# Patient Record
Sex: Male | Born: 1965 | Race: White | Hispanic: No | Marital: Single | Smoking: Former smoker
Health system: Southern US, Community
[De-identification: ages and names within clinical notes are randomized; demographics above are authoritative.]

## PROBLEM LIST (undated history)

## (undated) DIAGNOSIS — T7840XA Allergy, unspecified, initial encounter: Secondary | ICD-10-CM

## (undated) DIAGNOSIS — R0609 Other forms of dyspnea: Secondary | ICD-10-CM

## (undated) DIAGNOSIS — Z8719 Personal history of other diseases of the digestive system: Secondary | ICD-10-CM

## (undated) DIAGNOSIS — S0990XA Unspecified injury of head, initial encounter: Secondary | ICD-10-CM

## (undated) DIAGNOSIS — R7309 Other abnormal glucose: Secondary | ICD-10-CM

## (undated) DIAGNOSIS — R030 Elevated blood-pressure reading, without diagnosis of hypertension: Secondary | ICD-10-CM

## (undated) DIAGNOSIS — T887XXA Unspecified adverse effect of drug or medicament, initial encounter: Secondary | ICD-10-CM

## (undated) DIAGNOSIS — IMO0001 Reserved for inherently not codable concepts without codable children: Secondary | ICD-10-CM

## (undated) DIAGNOSIS — F329 Major depressive disorder, single episode, unspecified: Secondary | ICD-10-CM

## (undated) DIAGNOSIS — I10 Essential (primary) hypertension: Secondary | ICD-10-CM

## (undated) DIAGNOSIS — S069X9A Unspecified intracranial injury with loss of consciousness of unspecified duration, initial encounter: Secondary | ICD-10-CM

## (undated) DIAGNOSIS — G4733 Obstructive sleep apnea (adult) (pediatric): Secondary | ICD-10-CM

## (undated) DIAGNOSIS — R569 Unspecified convulsions: Secondary | ICD-10-CM

## (undated) DIAGNOSIS — G4730 Sleep apnea, unspecified: Secondary | ICD-10-CM

## (undated) DIAGNOSIS — E785 Hyperlipidemia, unspecified: Secondary | ICD-10-CM

## (undated) DIAGNOSIS — R55 Syncope and collapse: Secondary | ICD-10-CM

## (undated) DIAGNOSIS — F528 Other sexual dysfunction not due to a substance or known physiological condition: Secondary | ICD-10-CM

## (undated) DIAGNOSIS — I350 Nonrheumatic aortic (valve) stenosis: Secondary | ICD-10-CM

## (undated) DIAGNOSIS — R4789 Other speech disturbances: Secondary | ICD-10-CM

## (undated) DIAGNOSIS — G43909 Migraine, unspecified, not intractable, without status migrainosus: Secondary | ICD-10-CM

## (undated) HISTORY — DX: Unspecified convulsions: R56.9

## (undated) HISTORY — DX: Other abnormal glucose: R73.09

## (undated) HISTORY — DX: Elevated blood-pressure reading, without diagnosis of hypertension: R03.0

## (undated) HISTORY — DX: Reserved for inherently not codable concepts without codable children: IMO0001

## (undated) HISTORY — DX: Allergy, unspecified, initial encounter: T78.40XA

## (undated) HISTORY — DX: Syncope and collapse: R55

## (undated) HISTORY — DX: Migraine, unspecified, not intractable, without status migrainosus: G43.909

## (undated) HISTORY — DX: Unspecified injury of head, initial encounter: S09.90XA

## (undated) HISTORY — DX: Sleep apnea, unspecified: G47.30

## (undated) HISTORY — DX: Nonrheumatic aortic (valve) stenosis: I35.0

## (undated) HISTORY — PX: WISDOM TOOTH EXTRACTION: SHX21

## (undated) HISTORY — DX: Other sexual dysfunction not due to a substance or known physiological condition: F52.8

## (undated) HISTORY — PX: ANKLE SURGERY: SHX546

## (undated) HISTORY — DX: Major depressive disorder, single episode, unspecified: F32.9

## (undated) HISTORY — PX: VASECTOMY: SHX75

## (undated) HISTORY — DX: Other speech disturbances: R47.89

## (undated) HISTORY — DX: Other forms of dyspnea: R06.09

## (undated) HISTORY — DX: Obstructive sleep apnea (adult) (pediatric): G47.33

## (undated) HISTORY — DX: Unspecified intracranial injury with loss of consciousness of unspecified duration, initial encounter: S06.9X9A

## (undated) HISTORY — DX: Personal history of other diseases of the digestive system: Z87.19

## (undated) HISTORY — DX: Unspecified adverse effect of drug or medicament, initial encounter: T88.7XXA

## (undated) HISTORY — DX: Hyperlipidemia, unspecified: E78.5

## (undated) HISTORY — DX: Essential (primary) hypertension: I10

---
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
## 1999-12-15 ENCOUNTER — Encounter: Payer: Self-pay | Admitting: Emergency Medicine

## 1999-12-15 ENCOUNTER — Emergency Department (HOSPITAL_COMMUNITY): Admission: EM | Admit: 1999-12-15 | Discharge: 1999-12-15 | Payer: Self-pay | Admitting: Emergency Medicine

## 2003-11-29 ENCOUNTER — Encounter: Payer: Self-pay | Admitting: Cardiology

## 2003-11-29 ENCOUNTER — Ambulatory Visit (HOSPITAL_COMMUNITY): Admission: RE | Admit: 2003-11-29 | Discharge: 2003-11-29 | Payer: Self-pay | Admitting: Cardiology

## 2004-12-27 ENCOUNTER — Ambulatory Visit: Payer: Self-pay | Admitting: Internal Medicine

## 2005-01-14 ENCOUNTER — Ambulatory Visit: Payer: Self-pay | Admitting: Family Medicine

## 2005-05-05 ENCOUNTER — Ambulatory Visit: Payer: Self-pay | Admitting: Family Medicine

## 2005-05-13 ENCOUNTER — Ambulatory Visit: Payer: Self-pay | Admitting: Family Medicine

## 2005-08-11 ENCOUNTER — Ambulatory Visit: Payer: Self-pay | Admitting: Family Medicine

## 2005-08-18 ENCOUNTER — Ambulatory Visit: Payer: Self-pay | Admitting: Family Medicine

## 2005-11-18 ENCOUNTER — Ambulatory Visit: Payer: Self-pay | Admitting: Family Medicine

## 2005-11-23 ENCOUNTER — Ambulatory Visit: Payer: Self-pay | Admitting: Family Medicine

## 2006-05-11 ENCOUNTER — Ambulatory Visit: Payer: Self-pay | Admitting: Family Medicine

## 2006-05-24 ENCOUNTER — Ambulatory Visit: Payer: Self-pay | Admitting: Family Medicine

## 2006-06-08 ENCOUNTER — Ambulatory Visit: Payer: Self-pay | Admitting: Pulmonary Disease

## 2006-07-19 ENCOUNTER — Ambulatory Visit (HOSPITAL_BASED_OUTPATIENT_CLINIC_OR_DEPARTMENT_OTHER): Admission: RE | Admit: 2006-07-19 | Discharge: 2006-07-19 | Payer: Self-pay | Admitting: Pulmonary Disease

## 2006-07-24 ENCOUNTER — Ambulatory Visit: Payer: Self-pay | Admitting: Pulmonary Disease

## 2006-08-05 ENCOUNTER — Ambulatory Visit: Payer: Self-pay | Admitting: Pulmonary Disease

## 2006-10-29 ENCOUNTER — Ambulatory Visit: Payer: Self-pay | Admitting: Family Medicine

## 2006-10-29 LAB — CONVERTED CEMR LAB
ALT: 78 units/L — ABNORMAL HIGH (ref 0–40)
AST: 66 units/L — ABNORMAL HIGH (ref 0–37)
Albumin: 3.5 g/dL (ref 3.5–5.2)
Alkaline Phosphatase: 41 units/L (ref 39–117)
Bilirubin, Direct: 0.1 mg/dL (ref 0.0–0.3)
Cholesterol: 79 mg/dL (ref 0–200)
HDL: 20.5 mg/dL — ABNORMAL LOW (ref >39.0)
LDL Cholesterol: 32 mg/dL (ref 0–99)
Total Bilirubin: 0.6 mg/dL (ref 0.3–1.2)
Total CHOL/HDL Ratio: 3.9
Total Protein: 5.5 g/dL — ABNORMAL LOW (ref 6.0–8.3)
Triglycerides: 132 mg/dL (ref 0–149)
VLDL: 26 mg/dL (ref 0–40)

## 2007-04-15 DIAGNOSIS — F329 Major depressive disorder, single episode, unspecified: Secondary | ICD-10-CM

## 2007-04-15 DIAGNOSIS — F3289 Other specified depressive episodes: Secondary | ICD-10-CM

## 2007-04-15 DIAGNOSIS — Z8719 Personal history of other diseases of the digestive system: Secondary | ICD-10-CM

## 2007-04-15 HISTORY — DX: Other specified depressive episodes: F32.89

## 2007-04-15 HISTORY — DX: Major depressive disorder, single episode, unspecified: F32.9

## 2007-04-15 HISTORY — DX: Personal history of other diseases of the digestive system: Z87.19

## 2007-07-11 ENCOUNTER — Ambulatory Visit: Payer: Self-pay | Admitting: Family Medicine

## 2007-07-11 DIAGNOSIS — E785 Hyperlipidemia, unspecified: Secondary | ICD-10-CM

## 2007-07-11 HISTORY — DX: Hyperlipidemia, unspecified: E78.5

## 2007-07-13 LAB — CONVERTED CEMR LAB
ALT: 53 units/L (ref 0–53)
AST: 32 units/L (ref 0–37)
Albumin: 3.8 g/dL (ref 3.5–5.2)
Alkaline Phosphatase: 72 units/L (ref 39–117)
Bilirubin, Direct: 0.1 mg/dL (ref 0.0–0.3)
Cholesterol: 121 mg/dL (ref 0–200)
HDL: 21.2 mg/dL — ABNORMAL LOW (ref >39.0)
LDL Cholesterol: 74 mg/dL (ref 0–99)
Total Bilirubin: 0.6 mg/dL (ref 0.3–1.2)
Total CHOL/HDL Ratio: 5.7
Total Protein: 6.7 g/dL (ref 6.0–8.3)
Triglycerides: 129 mg/dL (ref 0–149)
VLDL: 26 mg/dL (ref 0–40)

## 2007-07-18 ENCOUNTER — Telehealth: Payer: Self-pay | Admitting: Family Medicine

## 2007-08-15 ENCOUNTER — Telehealth: Payer: Self-pay | Admitting: Family Medicine

## 2007-09-29 ENCOUNTER — Telehealth: Payer: Self-pay | Admitting: Family Medicine

## 2007-09-30 DIAGNOSIS — R4789 Other speech disturbances: Secondary | ICD-10-CM

## 2007-09-30 HISTORY — DX: Other speech disturbances: R47.89

## 2007-10-12 ENCOUNTER — Encounter: Admission: RE | Admit: 2007-10-12 | Discharge: 2007-11-03 | Payer: Self-pay | Admitting: Family Medicine

## 2007-10-25 ENCOUNTER — Encounter: Payer: Self-pay | Admitting: Family Medicine

## 2007-11-01 ENCOUNTER — Telehealth: Payer: Self-pay | Admitting: Family Medicine

## 2007-11-02 ENCOUNTER — Encounter: Payer: Self-pay | Admitting: Family Medicine

## 2008-03-30 ENCOUNTER — Telehealth: Payer: Self-pay | Admitting: Family Medicine

## 2008-04-02 ENCOUNTER — Ambulatory Visit: Payer: Self-pay | Admitting: Family Medicine

## 2008-04-02 DIAGNOSIS — F528 Other sexual dysfunction not due to a substance or known physiological condition: Secondary | ICD-10-CM

## 2008-04-02 DIAGNOSIS — S0990XA Unspecified injury of head, initial encounter: Secondary | ICD-10-CM | POA: Insufficient documentation

## 2008-04-02 HISTORY — DX: Other sexual dysfunction not due to a substance or known physiological condition: F52.8

## 2008-04-02 HISTORY — DX: Unspecified injury of head, initial encounter: S09.90XA

## 2008-04-24 ENCOUNTER — Encounter: Payer: Self-pay | Admitting: Family Medicine

## 2008-06-12 ENCOUNTER — Ambulatory Visit: Payer: Self-pay | Admitting: Family Medicine

## 2008-06-29 ENCOUNTER — Ambulatory Visit: Payer: Self-pay | Admitting: Family Medicine

## 2008-06-29 ENCOUNTER — Telehealth: Payer: Self-pay | Admitting: Family Medicine

## 2008-06-29 DIAGNOSIS — J029 Acute pharyngitis, unspecified: Secondary | ICD-10-CM

## 2008-08-06 ENCOUNTER — Ambulatory Visit: Payer: Self-pay | Admitting: Family Medicine

## 2008-11-15 ENCOUNTER — Ambulatory Visit: Payer: Self-pay | Admitting: Family Medicine

## 2008-11-16 LAB — CONVERTED CEMR LAB
ALT: 36 units/L (ref 0–53)
AST: 31 units/L (ref 0–37)
Albumin: 3.9 g/dL (ref 3.5–5.2)
Alkaline Phosphatase: 55 units/L (ref 39–117)
BUN: 9 mg/dL (ref 6–23)
Basophils Absolute: 0 10*3/uL (ref 0.0–0.1)
Basophils Relative: 0.6 % (ref 0.0–3.0)
Bilirubin, Direct: 0 mg/dL (ref 0.0–0.3)
CO2: 29 meq/L (ref 19–32)
Calcium: 9.3 mg/dL (ref 8.4–10.5)
Chloride: 108 meq/L (ref 96–112)
Cholesterol: 150 mg/dL (ref 0–200)
Creatinine, Ser: 1 mg/dL (ref 0.4–1.5)
Eosinophils Absolute: 0.2 10*3/uL (ref 0.0–0.7)
Eosinophils Relative: 3.1 % (ref 0.0–5.0)
GFR calc non Af Amer: 87.16 mL/min (ref >60)
Glucose, Bld: 87 mg/dL (ref 70–99)
HCT: 41.2 % (ref 39.0–52.0)
HDL: 25.7 mg/dL — ABNORMAL LOW (ref >39.00)
Hemoglobin: 14 g/dL (ref 13.0–17.0)
LDL Cholesterol: 87 mg/dL (ref 0–99)
Lymphocytes Relative: 37.2 % (ref 12.0–46.0)
Lymphs Abs: 2.1 10*3/uL (ref 0.7–4.0)
MCHC: 33.9 g/dL (ref 30.0–36.0)
MCV: 91.4 fL (ref 78.0–100.0)
Monocytes Absolute: 0.5 10*3/uL (ref 0.1–1.0)
Monocytes Relative: 9 % (ref 3.0–12.0)
Neutro Abs: 2.9 10*3/uL (ref 1.4–7.7)
Neutrophils Relative %: 50.1 % (ref 43.0–77.0)
Platelets: 240 10*3/uL (ref 150.0–400.0)
Potassium: 3.7 meq/L (ref 3.5–5.1)
RBC: 4.51 M/uL (ref 4.22–5.81)
RDW: 12.3 % (ref 11.5–14.6)
Sodium: 143 meq/L (ref 135–145)
TSH: 3.97 microintl units/mL (ref 0.35–5.50)
Total Bilirubin: 0.5 mg/dL (ref 0.3–1.2)
Total CHOL/HDL Ratio: 6
Total Protein: 6.5 g/dL (ref 6.0–8.3)
Triglycerides: 188 mg/dL — ABNORMAL HIGH (ref 0.0–149.0)
VLDL: 37.6 mg/dL (ref 0.0–40.0)
WBC: 5.7 10*3/uL (ref 4.5–10.5)

## 2009-03-07 ENCOUNTER — Ambulatory Visit: Payer: Self-pay | Admitting: Family Medicine

## 2009-03-18 ENCOUNTER — Encounter: Payer: Self-pay | Admitting: Family Medicine

## 2009-03-27 ENCOUNTER — Encounter: Payer: Self-pay | Admitting: Family Medicine

## 2009-05-22 ENCOUNTER — Telehealth: Payer: Self-pay | Admitting: Family Medicine

## 2009-09-24 ENCOUNTER — Encounter: Payer: Self-pay | Admitting: Family Medicine

## 2009-10-03 ENCOUNTER — Encounter: Admission: RE | Admit: 2009-10-03 | Discharge: 2009-10-03 | Payer: Self-pay | Admitting: Neurosurgery

## 2009-10-31 ENCOUNTER — Encounter: Payer: Self-pay | Admitting: Family Medicine

## 2009-11-19 ENCOUNTER — Ambulatory Visit: Payer: Self-pay | Admitting: Family Medicine

## 2009-11-19 ENCOUNTER — Telehealth: Payer: Self-pay | Admitting: Family Medicine

## 2009-11-19 DIAGNOSIS — J019 Acute sinusitis, unspecified: Secondary | ICD-10-CM | POA: Insufficient documentation

## 2009-11-19 DIAGNOSIS — IMO0001 Reserved for inherently not codable concepts without codable children: Secondary | ICD-10-CM

## 2009-11-19 HISTORY — DX: Reserved for inherently not codable concepts without codable children: IMO0001

## 2009-11-27 ENCOUNTER — Telehealth: Payer: Self-pay | Admitting: Family Medicine

## 2010-01-15 ENCOUNTER — Ambulatory Visit: Payer: Self-pay | Admitting: Family Medicine

## 2010-01-15 DIAGNOSIS — T887XXA Unspecified adverse effect of drug or medicament, initial encounter: Secondary | ICD-10-CM

## 2010-01-15 HISTORY — DX: Unspecified adverse effect of drug or medicament, initial encounter: T88.7XXA

## 2010-01-17 LAB — CONVERTED CEMR LAB
ALT: 29 units/L (ref 0–53)
AST: 28 units/L (ref 0–37)
Albumin: 4.3 g/dL (ref 3.5–5.2)
Alkaline Phosphatase: 61 units/L (ref 39–117)
BUN: 18 mg/dL (ref 6–23)
Basophils Absolute: 0 10*3/uL (ref 0.0–0.1)
Basophils Relative: 0.9 % (ref 0.0–3.0)
Bilirubin, Direct: 0.1 mg/dL (ref 0.0–0.3)
CO2: 27 meq/L (ref 19–32)
Calcium: 9.3 mg/dL (ref 8.4–10.5)
Chloride: 106 meq/L (ref 96–112)
Cholesterol: 224 mg/dL — ABNORMAL HIGH (ref 0–200)
Creatinine, Ser: 1.1 mg/dL (ref 0.4–1.5)
Direct LDL: 170 mg/dL
Eosinophils Absolute: 0.1 10*3/uL (ref 0.0–0.7)
Eosinophils Relative: 2.4 % (ref 0.0–5.0)
GFR calc non Af Amer: 81.93 mL/min (ref >60)
Glucose, Bld: 105 mg/dL — ABNORMAL HIGH (ref 70–99)
HCT: 44.8 % (ref 39.0–52.0)
HDL: 33.9 mg/dL — ABNORMAL LOW (ref >39.00)
Hemoglobin: 15.5 g/dL (ref 13.0–17.0)
Lymphocytes Relative: 35 % (ref 12.0–46.0)
Lymphs Abs: 2 10*3/uL (ref 0.7–4.0)
MCHC: 34.6 g/dL (ref 30.0–36.0)
MCV: 91.1 fL (ref 78.0–100.0)
Monocytes Absolute: 0.5 10*3/uL (ref 0.1–1.0)
Monocytes Relative: 8.2 % (ref 3.0–12.0)
Neutro Abs: 3.1 10*3/uL (ref 1.4–7.7)
Neutrophils Relative %: 53.5 % (ref 43.0–77.0)
Platelets: 291 10*3/uL (ref 150.0–400.0)
Potassium: 4.6 meq/L (ref 3.5–5.1)
RBC: 4.92 M/uL (ref 4.22–5.81)
RDW: 13.4 % (ref 11.5–14.6)
Sodium: 143 meq/L (ref 135–145)
TSH: 2.16 microintl units/mL (ref 0.35–5.50)
Total Bilirubin: 0.5 mg/dL (ref 0.3–1.2)
Total CHOL/HDL Ratio: 7
Total Protein: 6.7 g/dL (ref 6.0–8.3)
Triglycerides: 131 mg/dL (ref 0.0–149.0)
VLDL: 26.2 mg/dL (ref 0.0–40.0)
WBC: 5.7 10*3/uL (ref 4.5–10.5)

## 2010-01-30 ENCOUNTER — Telehealth: Payer: Self-pay | Admitting: Family Medicine

## 2010-01-31 ENCOUNTER — Encounter: Payer: Self-pay | Admitting: Family Medicine

## 2010-02-24 ENCOUNTER — Telehealth: Payer: Self-pay | Admitting: Family Medicine

## 2010-03-24 ENCOUNTER — Encounter: Payer: Self-pay | Admitting: Family Medicine

## 2010-04-08 ENCOUNTER — Encounter: Payer: Self-pay | Admitting: Family Medicine

## 2010-04-22 ENCOUNTER — Telehealth: Payer: Self-pay | Admitting: Family Medicine

## 2010-05-27 ENCOUNTER — Ambulatory Visit: Payer: Self-pay | Admitting: Family Medicine

## 2010-06-03 ENCOUNTER — Telehealth: Payer: Self-pay | Admitting: Family Medicine

## 2010-06-06 ENCOUNTER — Telehealth: Payer: Self-pay

## 2010-09-29 ENCOUNTER — Ambulatory Visit
Admission: RE | Admit: 2010-09-29 | Discharge: 2010-09-29 | Payer: Self-pay | Source: Home / Self Care | Attending: Family Medicine | Admitting: Family Medicine

## 2010-09-29 ENCOUNTER — Other Ambulatory Visit: Payer: Self-pay | Admitting: Family Medicine

## 2010-09-29 DIAGNOSIS — R7309 Other abnormal glucose: Secondary | ICD-10-CM | POA: Insufficient documentation

## 2010-09-29 DIAGNOSIS — R55 Syncope and collapse: Secondary | ICD-10-CM

## 2010-09-29 HISTORY — DX: Syncope and collapse: R55

## 2010-09-29 HISTORY — DX: Other abnormal glucose: R73.09

## 2010-09-29 LAB — BASIC METABOLIC PANEL
Chloride: 103 mEq/L (ref 96–112)
GFR: 76.59 mL/min (ref >60.00)
Glucose, Bld: 86 mg/dL (ref 70–99)
Potassium: 4.4 mEq/L (ref 3.5–5.1)
Sodium: 138 mEq/L (ref 135–145)

## 2010-09-29 LAB — LIPID PANEL
Cholesterol: 162 mg/dL (ref 0–200)
HDL: 31.1 mg/dL — ABNORMAL LOW (ref >39.00)
LDL Cholesterol: 96 mg/dL (ref 0–99)
Total CHOL/HDL Ratio: 5
Triglycerides: 174 mg/dL — ABNORMAL HIGH (ref 0.0–149.0)
VLDL: 34.8 mg/dL (ref 0.0–40.0)

## 2010-09-29 LAB — CBC WITH DIFFERENTIAL/PLATELET
Eosinophils Relative: 1.5 % (ref 0.0–5.0)
HCT: 37.3 % — ABNORMAL LOW (ref 39.0–52.0)
Hemoglobin: 12.6 g/dL — ABNORMAL LOW (ref 13.0–17.0)
Lymphs Abs: 2.4 10*3/uL (ref 0.7–4.0)
MCV: 87.3 fl (ref 78.0–100.0)
Monocytes Absolute: 0.5 10*3/uL (ref 0.1–1.0)
Monocytes Relative: 7.6 % (ref 3.0–12.0)
Neutro Abs: 3.9 10*3/uL (ref 1.4–7.7)
RDW: 15 % — ABNORMAL HIGH (ref 11.5–14.6)
WBC: 7 10*3/uL (ref 4.5–10.5)

## 2010-09-29 LAB — HEPATIC FUNCTION PANEL
ALT: 41 U/L (ref 0–53)
AST: 37 U/L (ref 0–37)
Albumin: 4.3 g/dL (ref 3.5–5.2)
Total Protein: 6.7 g/dL (ref 6.0–8.3)

## 2010-09-29 LAB — TSH: TSH: 2.16 u[IU]/mL (ref 0.35–5.50)

## 2010-09-30 NOTE — Miscellaneous (Signed)
Summary: new Rx  Medications Added CRESTOR 40 MG TABS (ROSUVASTATIN CALCIUM) 1 once daily       Clinical Lists Changes  Medications: Removed medication of CRESTOR 20 MG  TABS (ROSUVASTATIN CALCIUM) once daily Added new medication of CRESTOR 40 MG TABS (ROSUVASTATIN CALCIUM) 1 once daily - Signed Rx of CRESTOR 40 MG TABS (ROSUVASTATIN CALCIUM) 1 once daily;  #30 x 11;  Signed;  Entered by: Raechel Ache, RN;  Authorized by: Nelwyn Salisbury MD;  Method used: Electronically to [facility] [address] [place], [place]  [address], Ph: [phone], Fax: [phone]    Prescriptions: CRESTOR 40 MG TABS (ROSUVASTATIN CALCIUM) 1 once daily  #30 x 11   Entered by:   Raechel Ache, RN   Authorized by:   Nelwyn Salisbury MD   Signed by:   Raechel Ache, RN on 01/31/2010   Method used:   Electronically to        [facility]* (retail)       [address]       [place], [place]  [address]       Ph: [phone]       Fax: [phone]   RxID:   [id number]
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
## 2010-09-30 NOTE — Letter (Signed)
Summary: [facility]  [facility]   Imported By: Maryln Gottron 04/23/2010 15:21:45  _____________________________________________________________________  External Attachment:    Type:   Image     Comment:   External Document

## 2010-09-30 NOTE — Letter (Signed)
Summary: [facility]  [facility]   Imported By: Maryln Gottron 11/14/2009 13:43:12  _____________________________________________________________________  External Attachment:    Type:   Image     Comment:   External Document

## 2010-09-30 NOTE — Progress Notes (Signed)
Summary: REQ FOR SAMPLES (Crestor)  Phone Note Call from Patient   Caller: Patient  [phone] Summary of Call: Pt is requesting a few samples of med:  Crestor 40mg  (about a weeks worth if possible, enough to do him till he has the money to get his refill Rx)    ....Marland KitchenMarland KitchenPt was adv that Physician is out of the office today and will return in the am.... Pt adv he can be reached at [phone].  Initial call taken by: Debbra Riding,  January 30, 2010 2:13 PM  Follow-up for Phone Call        Samples obtained by Fleet Contras, CMA... Pt adv left up front for p/u.  Follow-up by: Debbra Riding,  January 30, 2010 2:21 PM

## 2010-09-30 NOTE — Consult Note (Signed)
Summary: [facility]  [facility]   Imported By: Maryln Gottron 03/27/2010 15:08:03  _____________________________________________________________________  External Attachment:    Type:   Image     Comment:   External Document

## 2010-09-30 NOTE — Progress Notes (Signed)
Summary: not much better  Phone Note Call from Patient   Caller: male Call For: Nelwyn Salisbury MD Summary of Call: not much better despite abx and decongestants- please advise. Call after 3:30 Initial call taken by: VM  Follow-up for Phone Call        switch to Augmentin 875 two times a day for 10 days. Please call this in.  Follow-up by: Nelwyn Salisbury MD,  November 27, 2009 4:09 PM  Additional Follow-up for Phone Call Additional follow up Details #1::        Rx Called In  to [facility]. Additional Follow-up by: Raechel Ache, RN,  November 27, 2009 4:34 PM

## 2010-09-30 NOTE — Progress Notes (Signed)
Summary: MED REFILL  Phone Note Refill Request Message from:  Patient  Refills Requested: Medication #1:  CRESTOR 40 MG TABS 1 once daily. [facility] [phone]  Initial call taken by: Heron Sabins,  June 06, 2010 1:21 PM  Follow-up for Phone Call        spoke with [facility] - pt has multiple refills avaible - disreguard request. KIK Follow-up by: Duard Brady LPN,  June 06, 2010 1:36 PM

## 2010-09-30 NOTE — Progress Notes (Signed)
Summary: change med  Phone Note Call from Patient   Caller: Patient Call For: Nelwyn Salisbury MD Summary of Call: says Crestor is making his body hurt- can he have something else?  ph- [phone] Initial call taken by: Raechel Ache, RN,  November 19, 2009 9:06 AM  Follow-up for Phone Call        before we do anything else he needs to come in for fasting labs, including lipids, hepatic, and a CK Follow-up by: Nelwyn Salisbury MD,  November 19, 2009 11:59 AM  Additional Follow-up for Phone Call Additional follow up Details #1::        LMOM needs fasting lab appt. Additional Follow-up by: Raechel Ache, RN,  November 19, 2009 12:08 PM    Additional Follow-up for Phone Call Additional follow up Details #2::    office visit today

## 2010-09-30 NOTE — Progress Notes (Signed)
Summary: crestor samples  Phone Note Call from Patient Call back at Home Phone [phone]   Caller: Patient Call For: Nelwyn Salisbury MD Summary of Call: pt needs crestor 40 mg samples Initial call taken by: Heron Sabins,  February 24, 2010 2:02 PM  Follow-up for Phone Call        please find him some samples Follow-up by: Nelwyn Salisbury MD,  February 24, 2010 2:20 PM  Additional Follow-up for Phone Call Additional follow up Details #1::        done. Additional Follow-up by: Raechel Ache, RN,  February 24, 2010 2:29 PM     Appended Document: crestor samples pt is aware

## 2010-09-30 NOTE — Progress Notes (Signed)
Summary: Pt req samples of Crestor 20mg   Phone Note Call from Patient Call back at Home Phone [phone]   Caller: Patient Summary of Call: Pt req samples of Crestor 20mg .    Pt is out of medicine and can not afford to by med.  Initial call taken by: Lucy Antigua,  June 03, 2010 1:21 PM  Follow-up for Phone Call        pt aware. 2 wks of samples of crestor 20mg   Follow-up by: Pura Spice, RN,  June 03, 2010 4:23 PM

## 2010-09-30 NOTE — Assessment & Plan Note (Signed)
Summary: FLU-SHOT/RCD   Nurse Visit   Allergies: No Known Drug Allergies  Orders Added: 1)  Flu Vaccine 59yrs + MEDICARE PATIENTS [Q2039] 2)  Administration Flu vaccine - MCR [G0008] Flu Vaccine Consent Questions     Do you have a history of severe allergic reactions to this vaccine? no    Any prior history of allergic reactions to egg and/or gelatin? no    Do you have a sensitivity to the preservative Thimersol? no    Do you have a past history of Guillan-Barre Syndrome? no    Do you currently have an acute febrile illness? no    Have you ever had a severe reaction to latex? no    Vaccine information given and explained to patient? yes    Are you currently pregnant? no    Lot Number:[id number]   Exp Date:02/28/2011   Site Given  Left Deltoid IM

## 2010-09-30 NOTE — Progress Notes (Signed)
Summary: Pt req samples of Crestor 20mg   Phone Note Call from Patient Call back at [facility] Phone [phone]   Caller: Patient Summary of Call: Pt req samples of Crestor 20mg . Would like to pick up today, if possible.  Initial call taken by: Lucy Antigua,  April 22, 2010 9:51 AM  Follow-up for Phone Call        please find him samples Follow-up by: Nelwyn Salisbury MD,  April 23, 2010 1:30 PM  Additional Follow-up for Phone Call Additional follow up Details #1::        called. Additional Follow-up by: Raechel Ache, RN,  April 23, 2010 1:46 PM

## 2010-09-30 NOTE — Letter (Signed)
Summary: [facility]  [facility]   Imported By: Maryln Gottron 10/02/2009 13:38:23  _____________________________________________________________________  External Attachment:    Type:   Image     Comment:   External Document
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
## 2011-01-16 NOTE — Procedures (Signed)
Patrick Pearson, Patrick Pearson NO.:  [id number]   MEDICAL RECORD NO.:  [id number]          PATIENT TYPE:  OUT   LOCATION:  SLEEP CENTER                 FACILITY:  [facility]   PHYSICIAN:  Barbaraann Share, MD,FCCPDATE OF BIRTH:  Apr 01, 1967   DATE OF STUDY:  07/19/2006                              NOCTURNAL POLYSOMNOGRAM   REFERRING PHYSICIAN:  Dr. Marcelyn Bruins   INDICATION FOR STUDY:  Hypersomnia with sleep apnea.   EPWORTH SLEEPINESS SCORE:  11.   SLEEP ARCHITECTURE:  The patient had total sleep time of 363 minutes with  decreased REM but adequate quantity of slow wave sleep for the patient's  age. Sleep onset latency was normal as was REM onset. Sleep efficiency was  mildly decreased at 88%.   RESPIRATORY DATA:  The patient was found to have 74 hypopneas and 251 apneas  for a respiratory disturbance index of 54 events per hour. The events  occurred primarily in the supine position and were clearly worse during REM.  There was loud to very loud snoring noted throughout.   OXYGEN DATA:  There was O2 desaturation as low at 58% with the patient's  obstructive events.   CARDIAC DATA:  Occasional PAC was noted but no clinically significant  arrhythmias.   MOVEMENT-PARASOMNIA:  No significant events noted.   IMPRESSIONS-RECOMMENDATIONS:  Severe obstructive sleep apnea/hypopnea  syndrome with a respiratory disturbance index of 54 events per hour and  oxygen desaturation as low as 58%. Treatment for this degree of sleep apnea  should focus primarily on weight loss if applicable as well as continuous  positive airway pressure (CPAP).      Barbaraann Share, MD,FCCP  Diplomate, American Board of Sleep  Medicine     [facility]  D:  07/23/2006 16:34:10  T:  07/23/2006 16:43:45  Job:  [id number]
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
## 2011-04-02 ENCOUNTER — Other Ambulatory Visit: Payer: Self-pay | Admitting: Family Medicine

## 2011-04-02 NOTE — Telephone Encounter (Signed)
Pt called 8/2. Would like some samples of Crestor. His Crestor did not come in the mail, and he isn't sure why. So at this point, he is out. Please call the pt about the samples, as well as about his Crestor Rx and why it may not have come to him.

## 2011-04-03 ENCOUNTER — Telehealth: Payer: Self-pay | Admitting: *Deleted

## 2011-04-03 NOTE — Telephone Encounter (Signed)
Script called in and pt aware that he needs to schedule a office visit.

## 2011-04-03 NOTE — Telephone Encounter (Signed)
Pt requesting samples of Crestor 40mg .

## 2011-04-03 NOTE — Telephone Encounter (Signed)
Script was called in.

## 2011-04-03 NOTE — Telephone Encounter (Signed)
Call in #30 with no rf. He needs an OV for any more

## 2011-04-28 ENCOUNTER — Other Ambulatory Visit: Payer: Self-pay | Admitting: Family Medicine

## 2011-04-28 ENCOUNTER — Other Ambulatory Visit (INDEPENDENT_AMBULATORY_CARE_PROVIDER_SITE_OTHER): Payer: Medicare Other

## 2011-04-28 DIAGNOSIS — E785 Hyperlipidemia, unspecified: Secondary | ICD-10-CM

## 2011-04-28 DIAGNOSIS — Z0000 Encounter for general adult medical examination without abnormal findings: Secondary | ICD-10-CM

## 2011-04-28 LAB — HEPATIC FUNCTION PANEL
AST: 37 U/L (ref 0–37)
Alkaline Phosphatase: 65 U/L (ref 39–117)
Bilirubin, Direct: 0 mg/dL (ref 0.0–0.3)
Total Bilirubin: 0.6 mg/dL (ref 0.3–1.2)

## 2011-04-28 LAB — LIPID PANEL: Total CHOL/HDL Ratio: 3

## 2011-04-29 ENCOUNTER — Encounter: Payer: Self-pay | Admitting: Family Medicine

## 2011-04-29 ENCOUNTER — Telehealth: Payer: Self-pay | Admitting: Family Medicine

## 2011-04-29 ENCOUNTER — Ambulatory Visit (INDEPENDENT_AMBULATORY_CARE_PROVIDER_SITE_OTHER): Payer: Medicare Other | Admitting: Family Medicine

## 2011-04-29 VITALS — BP 128/84 | HR 58 | Temp 98.5°F | Wt 268.0 lb

## 2011-04-29 DIAGNOSIS — I10 Essential (primary) hypertension: Secondary | ICD-10-CM

## 2011-04-29 DIAGNOSIS — E785 Hyperlipidemia, unspecified: Secondary | ICD-10-CM

## 2011-04-29 MED ORDER — ROSUVASTATIN CALCIUM 40 MG PO TABS: 40.0000 mg | ORAL_TABLET | Freq: Every day | ORAL | Status: DC

## 2011-04-29 NOTE — Progress Notes (Signed)
  Subjective:    Patient ID: Patrick Pearson, male    DOB: Jan 28, 1966, 45 y.o.   MRN: [id number]  HPI Here to follow up on HTN and lipids. He feels great and has no concerns. He rides his bicycle for 10 miles every other day. He watches his diet. His recent labs were excellent.    Review of Systems  Constitutional: Negative.   Respiratory: Negative.   Cardiovascular: Negative.        Objective:   Physical Exam  Constitutional: He appears well-developed and well-nourished.  Cardiovascular: Normal rate, regular rhythm, normal heart sounds and intact distal pulses.   Pulmonary/Chest: Effort normal and breath sounds normal.          Assessment & Plan:  He is doing well. Stay on current regimen.

## 2011-04-29 NOTE — Telephone Encounter (Signed)
Pt is here for appointment today and Dr. Clent Ridges will be going over results.

## 2011-04-29 NOTE — Telephone Encounter (Signed)
Message copied by Baldemar Friday on Wed Apr 29, 2011 10:48 AM ------      Message from: Gershon Crane A      Created: Wed Apr 29, 2011 10:47 AM       Patient aware

## 2011-09-03 DIAGNOSIS — S93409A Sprain of unspecified ligament of unspecified ankle, initial encounter: Secondary | ICD-10-CM | POA: Diagnosis not present

## 2011-09-17 DIAGNOSIS — S93409A Sprain of unspecified ligament of unspecified ankle, initial encounter: Secondary | ICD-10-CM | POA: Diagnosis not present

## 2011-10-16 ENCOUNTER — Encounter: Payer: Self-pay | Admitting: Family Medicine

## 2011-10-16 ENCOUNTER — Ambulatory Visit (INDEPENDENT_AMBULATORY_CARE_PROVIDER_SITE_OTHER): Payer: Medicare Other | Admitting: Family Medicine

## 2011-10-16 VITALS — BP 120/74 | HR 69 | Temp 97.4°F | Wt 284.0 lb

## 2011-10-16 DIAGNOSIS — R0602 Shortness of breath: Secondary | ICD-10-CM | POA: Diagnosis not present

## 2011-10-16 DIAGNOSIS — R011 Cardiac murmur, unspecified: Secondary | ICD-10-CM | POA: Diagnosis not present

## 2011-10-16 DIAGNOSIS — I350 Nonrheumatic aortic (valve) stenosis: Secondary | ICD-10-CM

## 2011-10-16 DIAGNOSIS — D649 Anemia, unspecified: Secondary | ICD-10-CM | POA: Diagnosis not present

## 2011-10-16 DIAGNOSIS — I359 Nonrheumatic aortic valve disorder, unspecified: Secondary | ICD-10-CM

## 2011-10-16 LAB — CBC WITH DIFFERENTIAL/PLATELET
Basophils Relative: 0.4 % (ref 0.0–3.0)
Eosinophils Absolute: 0.2 10*3/uL (ref 0.0–0.7)
Eosinophils Relative: 2.4 % (ref 0.0–5.0)
Hemoglobin: 14.5 g/dL (ref 13.0–17.0)
Lymphocytes Relative: 40.1 % (ref 12.0–46.0)
MCHC: 33.3 g/dL (ref 30.0–36.0)
Neutro Abs: 3.4 10*3/uL (ref 1.4–7.7)
Neutrophils Relative %: 48.7 % (ref 43.0–77.0)
RBC: 5.08 Mil/uL (ref 4.22–5.81)
WBC: 6.9 10*3/uL (ref 4.5–10.5)

## 2011-10-16 NOTE — Progress Notes (Signed)
  Subjective:    Patient ID: Patrick Pearson, male    DOB: Oct 25, 1965, 46 y.o.   MRN: [id number]  HPI Here with his father for an episode of SOB and wheezing which occurred about one week ago. They were in [place] to see a basketball game. They were hurrying to get to the stadium and were walking up a steep hill at a rapid pace when Tommy became SOB and wheezed a little. No chest pain or nausea or sweats. After resting a bit this all passed and they went to the game. He has been fine ever since, and he has been riding his bicycle every day as usual with no difficulty breathing. Of note, I remember sending him for an ECHO some years ago for a murmur, and he had slight aortic stenosis or sclerosis at that time. Unfortunately I cannot access this report in our computer.    Review of Systems  Constitutional: Negative.   Respiratory: Positive for chest tightness, shortness of breath and wheezing. Negative for cough.   Cardiovascular: Negative.        Objective:   Physical Exam  Constitutional: He appears well-developed and well-nourished.  Neck: No thyromegaly present.  Cardiovascular: Normal rate, regular rhythm and intact distal pulses.  Exam reveals no gallop and no friction rub.        There is a soft 2/6 SM over the aortic area   Pulmonary/Chest: Effort normal and breath sounds normal. No respiratory distress. He has no wheezes. He has no rales. He exhibits no tenderness.  Lymphadenopathy:    He has no cervical adenopathy.          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 We will send him back to see Dr. Shelle Iron to evaluate his pulmonary status. Check a CBC today since he has a hx of anemia. We will set up another ECHO soon to evaluate his aortic valve.

## 2011-10-20 NOTE — Progress Notes (Signed)
Quick Note:  Left voice message ______ 

## 2011-10-26 ENCOUNTER — Other Ambulatory Visit: Payer: Self-pay

## 2011-10-26 ENCOUNTER — Ambulatory Visit (HOSPITAL_COMMUNITY): Payer: Medicare Other | Attending: Cardiology

## 2011-10-26 DIAGNOSIS — R0609 Other forms of dyspnea: Secondary | ICD-10-CM | POA: Insufficient documentation

## 2011-10-26 DIAGNOSIS — R011 Cardiac murmur, unspecified: Secondary | ICD-10-CM | POA: Diagnosis not present

## 2011-10-26 DIAGNOSIS — E785 Hyperlipidemia, unspecified: Secondary | ICD-10-CM | POA: Diagnosis not present

## 2011-10-26 DIAGNOSIS — R0989 Other specified symptoms and signs involving the circulatory and respiratory systems: Secondary | ICD-10-CM | POA: Insufficient documentation

## 2011-10-28 NOTE — Progress Notes (Signed)
Addended by: Gershon Crane A on: 10/28/2011 08:38 AM   Modules accepted: Orders

## 2011-10-29 NOTE — Progress Notes (Signed)
Quick Note:  Spoke with pt ______ 

## 2011-11-03 ENCOUNTER — Ambulatory Visit (INDEPENDENT_AMBULATORY_CARE_PROVIDER_SITE_OTHER)
Admission: RE | Admit: 2011-11-03 | Discharge: 2011-11-03 | Disposition: A | Discharge status: Home / Self Care | Payer: Medicare Other | Source: Ambulatory Visit | Attending: Pulmonary Disease | Admitting: Pulmonary Disease

## 2011-11-03 ENCOUNTER — Ambulatory Visit (INDEPENDENT_AMBULATORY_CARE_PROVIDER_SITE_OTHER): Payer: Medicare Other | Admitting: Pulmonary Disease

## 2011-11-03 ENCOUNTER — Encounter: Payer: Self-pay | Admitting: Pulmonary Disease

## 2011-11-03 DIAGNOSIS — G4733 Obstructive sleep apnea (adult) (pediatric): Secondary | ICD-10-CM

## 2011-11-03 DIAGNOSIS — R0602 Shortness of breath: Secondary | ICD-10-CM | POA: Diagnosis not present

## 2011-11-03 DIAGNOSIS — R0989 Other specified symptoms and signs involving the circulatory and respiratory systems: Secondary | ICD-10-CM

## 2011-11-03 DIAGNOSIS — R0600 Dyspnea, unspecified: Secondary | ICD-10-CM

## 2011-11-03 DIAGNOSIS — R0609 Other forms of dyspnea: Secondary | ICD-10-CM

## 2011-11-03 HISTORY — DX: Other forms of dyspnea: R06.09

## 2011-11-03 HISTORY — DX: Obstructive sleep apnea (adult) (pediatric): G47.33

## 2011-11-03 HISTORY — DX: Dyspnea, unspecified: R06.00

## 2011-11-03 NOTE — Patient Instructions (Signed)
Will check cxr today, and call you with results. Will schedule for breathing studies, and see you back on same day for review Will have advanced make sure your cpap machine is working properly

## 2011-11-03 NOTE — Assessment & Plan Note (Signed)
The patient has a history of severe sleep apnea, but tells me that he has been wearing CPAP compliantly.  I have not seen him were evaluated him since 2007.  Will have his DME check the functioning of his machine, and also make sure that his supplies and mask are up to date.

## 2011-11-03 NOTE — Progress Notes (Signed)
  Subjective:    Patient ID: Patrick Pearson, male    DOB: February 18, 1966, 46 y.o.   MRN: [id number]  HPI The patient is a 46 year old male who been asked to see for dyspnea on exertion.  I have seen in the distant past for severe obstructive sleep apnea, but have not seen him since 2007.  He tells me that he has been staying compliant with CPAP.  His current problem today is dyspnea on exertion of less than one year duration, and he feels that it may be getting worse.  His level of shortness of breath has day to day variability, where he can ride a bicycle 6 miles on one day, and yet get winded walking up a hill on others.  He will sometimes get short of breath walking up one flight of stairs.  The patient denies any cough or mucus production.  He has some lower extremity edema, but has had a recent echo that was really unremarkable.  The patient states that he has had significant weight gain in the last 1-2 years.  He has not had a chest x-ray or recent pulmonary function studies.   Review of Systems  Constitutional: Negative for fever and unexpected weight change.  HENT: Negative for ear pain, nosebleeds, congestion, sore throat, rhinorrhea, sneezing, trouble swallowing, dental problem, postnasal drip and sinus pressure.   Eyes: Negative for redness and itching.  Respiratory: Positive for shortness of breath. Negative for cough, chest tightness and wheezing.   Cardiovascular: Negative for palpitations and leg swelling.  Gastrointestinal: Negative for nausea and vomiting.  Genitourinary: Negative for dysuria.  Musculoskeletal: Negative for joint swelling.  Skin: Negative for rash.  Neurological: Negative for headaches.  Hematological: Does not bruise/bleed easily.  Psychiatric/Behavioral: Negative for dysphoric mood. The patient is not nervous/anxious.        Objective:   Physical Exam Constitutional:  Morbidly obese, no acute distress  HENT:  Nares patent without discharge  Oropharynx without  exudate, palate and uvula are elongated  Eyes:  Perrla, left eye with lateral deviation, no scleral icterus  Neck:  No JVD, no TMG  Cardiovascular:  Normal rate, regular rhythm, no rubs or gallops.  2/6 sem        Intact distal pulses  Pulmonary :  Normal breath sounds, no stridor or respiratory distress   No rales, rhonchi, or wheezing.  intermittant upper airway wheezing  Abdominal:  Soft, nondistended, bowel sounds present.  No tenderness noted.   Musculoskeletal:  Minimal lower extremity edema noted.  Lymph Nodes:  No cervical lymphadenopathy noted  Skin:  No cyanosis noted  Neurologic:  Alert, appropriate, moves all 4 extremities without obvious deficit.         Assessment & Plan:

## 2011-11-03 NOTE — Assessment & Plan Note (Signed)
The patient has dyspnea with primarily moderate to heavy exertion, and it is not on a consistent basis.  He does have a history of smoking, and is also morbidly obese.  I think he needs to have a chest x-ray for completeness, and will also schedule for full pulmonary function studies to rule out obstructive and restrictive disease.  This may simply be an issue with his weight and conditioning.  The patient tells me that he also has an appointment with a cardiologist upcoming.

## 2011-11-17 ENCOUNTER — Encounter: Payer: Self-pay | Admitting: Pulmonary Disease

## 2011-11-17 ENCOUNTER — Ambulatory Visit (HOSPITAL_COMMUNITY)
Admission: RE | Admit: 2011-11-17 | Discharge: 2011-11-17 | Disposition: A | Discharge status: Home / Self Care | Payer: Medicare Other | Source: Ambulatory Visit | Attending: Pulmonary Disease | Admitting: Pulmonary Disease

## 2011-11-17 ENCOUNTER — Ambulatory Visit (INDEPENDENT_AMBULATORY_CARE_PROVIDER_SITE_OTHER): Payer: Medicare Other | Admitting: Pulmonary Disease

## 2011-11-17 VITALS — BP 160/98 | HR 64 | Temp 98.2°F | Ht 74.0 in | Wt 284.0 lb

## 2011-11-17 DIAGNOSIS — R0609 Other forms of dyspnea: Secondary | ICD-10-CM | POA: Diagnosis not present

## 2011-11-17 DIAGNOSIS — R0989 Other specified symptoms and signs involving the circulatory and respiratory systems: Secondary | ICD-10-CM | POA: Insufficient documentation

## 2011-11-17 LAB — PULMONARY FUNCTION TEST

## 2011-11-17 MED ORDER — ALBUTEROL SULFATE (5 MG/ML) 0.5% IN NEBU
2.5000 mg | INHALATION_SOLUTION | Freq: Once | RESPIRATORY_TRACT | Status: AC
  Administered 2011-11-17: 2.5 mg via RESPIRATORY_TRACT

## 2011-11-17 NOTE — Progress Notes (Signed)
  Subjective:    Patient ID: Patrick Pearson, male    DOB: 01-Dec-1965, 46 y.o.   MRN: [id number]  HPI The patient comes in today for followup after his recent pulmonary function studies, ordered as part of a workup for dyspnea on exertion.  He was found to have no obstruction, and no restriction, and his diffusion capacity was totally normal.  I have reviewed the study with him in detail, and answered all of his questions.   Review of Systems  Constitutional: Negative for fever and unexpected weight change.  HENT: Positive for congestion, rhinorrhea, sneezing and postnasal drip. Negative for ear pain, nosebleeds, sore throat, trouble swallowing, dental problem and sinus pressure.   Eyes: Negative for redness and itching.  Respiratory: Positive for shortness of breath. Negative for cough, chest tightness and wheezing.   Cardiovascular: Negative for palpitations and leg swelling.  Gastrointestinal: Negative for nausea and vomiting.  Genitourinary: Negative for dysuria.  Musculoskeletal: Negative for joint swelling.  Skin: Negative for rash.  Neurological: Negative for headaches.  Hematological: Does not bruise/bleed easily.  Psychiatric/Behavioral: Negative for dysphoric mood. The patient is not nervous/anxious.        Objective:   Physical Exam Ow male in nad Nose without purulence or discharge noted LE with no significant edema or cyanosis Alert, oriented, moves all 4.        Assessment & Plan:

## 2011-11-17 NOTE — Patient Instructions (Signed)
Your xray and breathing studies are normal.  I do not see a lung reason at this moment to explain your shortness of breath.  Let's see what cardiology finds out. Work on weight loss and conditioning.

## 2011-11-17 NOTE — Assessment & Plan Note (Signed)
The patient has a clear chest x-ray and also normal full pulmonary function studies.  I see no obvious pulmonary issue at this time to explain his dyspnea on exertion.  I suspect her weight and deconditioning are his primary issues, but he also has an upcoming cardiology evaluation as well.  If the patient does not see improvement with weight reduction and an exercise program, I would consider a cardiopulmonary exercise test as the next step.

## 2011-11-20 ENCOUNTER — Encounter: Payer: Self-pay | Admitting: Cardiology

## 2011-11-20 ENCOUNTER — Ambulatory Visit (INDEPENDENT_AMBULATORY_CARE_PROVIDER_SITE_OTHER): Payer: Medicare Other | Admitting: Cardiology

## 2011-11-20 VITALS — BP 128/86 | HR 71 | Wt 282.8 lb

## 2011-11-20 DIAGNOSIS — I350 Nonrheumatic aortic (valve) stenosis: Secondary | ICD-10-CM

## 2011-11-20 DIAGNOSIS — R0602 Shortness of breath: Secondary | ICD-10-CM

## 2011-11-20 DIAGNOSIS — I359 Nonrheumatic aortic valve disorder, unspecified: Secondary | ICD-10-CM

## 2011-11-20 DIAGNOSIS — R0609 Other forms of dyspnea: Secondary | ICD-10-CM | POA: Diagnosis not present

## 2011-11-20 HISTORY — DX: Nonrheumatic aortic (valve) stenosis: I35.0

## 2011-11-20 LAB — BRAIN NATRIURETIC PEPTIDE: Brain Natriuretic Peptide: 2.7 pg/mL (ref 0.0–100.0)

## 2011-11-20 NOTE — Assessment & Plan Note (Signed)
This appears to be mild. We can follow this clinically and I discussed this with the patient and his father. I will plan an echocardiogram in one year.

## 2011-11-20 NOTE — Patient Instructions (Signed)
Please have blood work today  The current medical regimen is effective;  continue present plan and medications.  Your physician has requested that you have an echocardiogram in 1 yr. Echocardiography is a painless test that uses sound waves to create images of your heart. It provides your doctor with information about the size and shape of your heart and how well your heart's chambers and valves are working. This procedure takes approximately one hour. There are no restrictions for this procedure.   Follow up in 1 year with Dr Antoine Poche.  You will receive a letter in the mail 2 months before you are due.  Please call us when you receive this letter to schedule your follow up appointment.
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
## 2011-11-20 NOTE — Assessment & Plan Note (Signed)
I suspect that this is multifactorial and related to weight and deconditioning. I will check a BNP level however. At this point I do not strongly suspect the valve as an etiology.

## 2012-01-23 DIAGNOSIS — S52043A Displaced fracture of coronoid process of unspecified ulna, initial encounter for closed fracture: Secondary | ICD-10-CM | POA: Diagnosis not present

## 2012-01-28 DIAGNOSIS — S52123A Displaced fracture of head of unspecified radius, initial encounter for closed fracture: Secondary | ICD-10-CM | POA: Diagnosis not present

## 2012-02-04 DIAGNOSIS — S52123A Displaced fracture of head of unspecified radius, initial encounter for closed fracture: Secondary | ICD-10-CM | POA: Diagnosis not present

## 2012-02-11 DIAGNOSIS — S52123A Displaced fracture of head of unspecified radius, initial encounter for closed fracture: Secondary | ICD-10-CM | POA: Diagnosis not present

## 2012-02-29 DIAGNOSIS — S52123A Displaced fracture of head of unspecified radius, initial encounter for closed fracture: Secondary | ICD-10-CM | POA: Diagnosis not present

## 2012-03-21 DIAGNOSIS — S52123A Displaced fracture of head of unspecified radius, initial encounter for closed fracture: Secondary | ICD-10-CM | POA: Diagnosis not present

## 2012-04-04 DIAGNOSIS — S52123A Displaced fracture of head of unspecified radius, initial encounter for closed fracture: Secondary | ICD-10-CM | POA: Diagnosis not present

## 2012-05-03 DIAGNOSIS — S52123A Displaced fracture of head of unspecified radius, initial encounter for closed fracture: Secondary | ICD-10-CM | POA: Diagnosis not present

## 2012-05-19 DIAGNOSIS — Z23 Encounter for immunization: Secondary | ICD-10-CM | POA: Diagnosis not present

## 2012-05-27 ENCOUNTER — Encounter (HOSPITAL_COMMUNITY): Payer: Self-pay | Admitting: Emergency Medicine

## 2012-05-27 ENCOUNTER — Emergency Department (HOSPITAL_COMMUNITY)
Admission: EM | Admit: 2012-05-27 | Discharge: 2012-05-27 | Disposition: A | Discharge status: Home / Self Care | Payer: Medicare Other | Attending: Emergency Medicine | Admitting: Emergency Medicine

## 2012-05-27 ENCOUNTER — Emergency Department (HOSPITAL_COMMUNITY): Payer: Medicare Other

## 2012-05-27 DIAGNOSIS — S42023A Displaced fracture of shaft of unspecified clavicle, initial encounter for closed fracture: Secondary | ICD-10-CM | POA: Insufficient documentation

## 2012-05-27 DIAGNOSIS — W19XXXA Unspecified fall, initial encounter: Secondary | ICD-10-CM

## 2012-05-27 DIAGNOSIS — Z87820 Personal history of traumatic brain injury: Secondary | ICD-10-CM | POA: Insufficient documentation

## 2012-05-27 DIAGNOSIS — F3289 Other specified depressive episodes: Secondary | ICD-10-CM | POA: Insufficient documentation

## 2012-05-27 DIAGNOSIS — Z23 Encounter for immunization: Secondary | ICD-10-CM | POA: Insufficient documentation

## 2012-05-27 DIAGNOSIS — Z87891 Personal history of nicotine dependence: Secondary | ICD-10-CM | POA: Insufficient documentation

## 2012-05-27 DIAGNOSIS — F329 Major depressive disorder, single episode, unspecified: Secondary | ICD-10-CM | POA: Insufficient documentation

## 2012-05-27 DIAGNOSIS — IMO0002 Reserved for concepts with insufficient information to code with codable children: Secondary | ICD-10-CM | POA: Diagnosis not present

## 2012-05-27 DIAGNOSIS — Y9355 Activity, bike riding: Secondary | ICD-10-CM | POA: Insufficient documentation

## 2012-05-27 DIAGNOSIS — G4730 Sleep apnea, unspecified: Secondary | ICD-10-CM | POA: Insufficient documentation

## 2012-05-27 DIAGNOSIS — T148XXA Other injury of unspecified body region, initial encounter: Secondary | ICD-10-CM | POA: Diagnosis not present

## 2012-05-27 DIAGNOSIS — T07XXXA Unspecified multiple injuries, initial encounter: Secondary | ICD-10-CM

## 2012-05-27 DIAGNOSIS — E785 Hyperlipidemia, unspecified: Secondary | ICD-10-CM | POA: Insufficient documentation

## 2012-05-27 DIAGNOSIS — M25519 Pain in unspecified shoulder: Secondary | ICD-10-CM | POA: Diagnosis not present

## 2012-05-27 MED ORDER — TETANUS-DIPHTH-ACELL PERTUSSIS 5-2.5-18.5 LF-MCG/0.5 IM SUSP
0.5000 mL | Freq: Once | INTRAMUSCULAR | Status: AC
  Administered 2012-05-27: 0.5 mL via INTRAMUSCULAR
  Filled 2012-05-27: qty 0.5

## 2012-05-27 MED ORDER — IBUPROFEN 600 MG PO TABS: 600.0000 mg | ORAL_TABLET | Freq: Four times a day (QID) | ORAL | Status: DC | PRN

## 2012-05-27 MED ORDER — OXYCODONE-ACETAMINOPHEN 5-325 MG PO TABS
1.0000 | ORAL_TABLET | Freq: Once | ORAL | Status: AC
  Administered 2012-05-27: 1 via ORAL
  Filled 2012-05-27: qty 1

## 2012-05-27 MED ORDER — ACETAMINOPHEN 325 MG PO TABS
ORAL_TABLET | ORAL | Status: AC
  Administered 2012-05-27: 650 mg
  Filled 2012-05-27: qty 2

## 2012-05-27 MED ORDER — OXYCODONE-ACETAMINOPHEN 5-325 MG PO TABS: 1.0000 | ORAL_TABLET | Freq: Four times a day (QID) | ORAL | Status: DC | PRN

## 2012-05-27 MED ORDER — ACETAMINOPHEN 325 MG PO TABS: 650.0000 mg | ORAL_TABLET | Freq: Once | ORAL | Status: AC

## 2012-05-27 MED ORDER — BACITRACIN ZINC 500 UNIT/GM EX OINT
TOPICAL_OINTMENT | CUTANEOUS | Status: AC
  Administered 2012-05-27: 16:00:00
  Filled 2012-05-27: qty 0.9

## 2012-05-27 NOTE — ED Notes (Signed)
Ice pack applied to left shoulder  
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
## 2012-05-31 DIAGNOSIS — S42009A Fracture of unspecified part of unspecified clavicle, initial encounter for closed fracture: Secondary | ICD-10-CM | POA: Diagnosis not present

## 2012-06-14 DIAGNOSIS — S52123A Displaced fracture of head of unspecified radius, initial encounter for closed fracture: Secondary | ICD-10-CM | POA: Diagnosis not present

## 2012-06-14 DIAGNOSIS — S42009A Fracture of unspecified part of unspecified clavicle, initial encounter for closed fracture: Secondary | ICD-10-CM | POA: Diagnosis not present

## 2012-06-28 DIAGNOSIS — S42009A Fracture of unspecified part of unspecified clavicle, initial encounter for closed fracture: Secondary | ICD-10-CM | POA: Diagnosis not present

## 2012-07-15 DIAGNOSIS — S42009A Fracture of unspecified part of unspecified clavicle, initial encounter for closed fracture: Secondary | ICD-10-CM | POA: Diagnosis not present

## 2012-08-05 DIAGNOSIS — S42309D Unspecified fracture of shaft of humerus, unspecified arm, subsequent encounter for fracture with routine healing: Secondary | ICD-10-CM | POA: Diagnosis not present

## 2012-09-01 ENCOUNTER — Other Ambulatory Visit: Payer: Self-pay | Admitting: Family Medicine

## 2012-09-02 ENCOUNTER — Emergency Department (HOSPITAL_COMMUNITY): Payer: Medicare Other

## 2012-09-02 ENCOUNTER — Encounter (HOSPITAL_COMMUNITY): Payer: Self-pay | Admitting: Emergency Medicine

## 2012-09-02 ENCOUNTER — Emergency Department (HOSPITAL_COMMUNITY)
Admission: EM | Admit: 2012-09-02 | Discharge: 2012-09-02 | Disposition: A | Discharge status: Home / Self Care | Payer: Medicare Other | Attending: Emergency Medicine | Admitting: Emergency Medicine

## 2012-09-02 DIAGNOSIS — Z79899 Other long term (current) drug therapy: Secondary | ICD-10-CM | POA: Diagnosis not present

## 2012-09-02 DIAGNOSIS — K5730 Diverticulosis of large intestine without perforation or abscess without bleeding: Secondary | ICD-10-CM | POA: Diagnosis not present

## 2012-09-02 DIAGNOSIS — Z8659 Personal history of other mental and behavioral disorders: Secondary | ICD-10-CM | POA: Diagnosis not present

## 2012-09-02 DIAGNOSIS — Z87891 Personal history of nicotine dependence: Secondary | ICD-10-CM | POA: Diagnosis not present

## 2012-09-02 DIAGNOSIS — Z87820 Personal history of traumatic brain injury: Secondary | ICD-10-CM | POA: Diagnosis not present

## 2012-09-02 DIAGNOSIS — Z8719 Personal history of other diseases of the digestive system: Secondary | ICD-10-CM | POA: Diagnosis not present

## 2012-09-02 DIAGNOSIS — N200 Calculus of kidney: Secondary | ICD-10-CM | POA: Diagnosis not present

## 2012-09-02 DIAGNOSIS — Z9981 Dependence on supplemental oxygen: Secondary | ICD-10-CM | POA: Insufficient documentation

## 2012-09-02 DIAGNOSIS — Z8679 Personal history of other diseases of the circulatory system: Secondary | ICD-10-CM | POA: Insufficient documentation

## 2012-09-02 DIAGNOSIS — R1031 Right lower quadrant pain: Secondary | ICD-10-CM | POA: Diagnosis not present

## 2012-09-02 DIAGNOSIS — Z87448 Personal history of other diseases of urinary system: Secondary | ICD-10-CM | POA: Insufficient documentation

## 2012-09-02 DIAGNOSIS — G4730 Sleep apnea, unspecified: Secondary | ICD-10-CM | POA: Insufficient documentation

## 2012-09-02 DIAGNOSIS — E7800 Pure hypercholesterolemia, unspecified: Secondary | ICD-10-CM | POA: Diagnosis not present

## 2012-09-02 LAB — URINALYSIS, MICROSCOPIC ONLY
Bilirubin Urine: NEGATIVE
Glucose, UA: NEGATIVE mg/dL
Hgb urine dipstick: NEGATIVE
Protein, ur: 30 mg/dL — AB
Urobilinogen, UA: 0.2 mg/dL (ref 0.0–1.0)

## 2012-09-02 LAB — COMPREHENSIVE METABOLIC PANEL
ALT: 42 U/L (ref 0–53)
AST: 35 U/L (ref 0–37)
Alkaline Phosphatase: 84 U/L (ref 39–117)
CO2: 26 mEq/L (ref 19–32)
Calcium: 9.7 mg/dL (ref 8.4–10.5)
Chloride: 102 mEq/L (ref 96–112)
GFR calc Af Amer: 63 mL/min — ABNORMAL LOW (ref >90)
GFR calc non Af Amer: 55 mL/min — ABNORMAL LOW (ref >90)
Glucose, Bld: 122 mg/dL — ABNORMAL HIGH (ref 70–99)
Potassium: 3.9 mEq/L (ref 3.5–5.1)
Sodium: 141 mEq/L (ref 135–145)
Total Bilirubin: 0.4 mg/dL (ref 0.3–1.2)

## 2012-09-02 LAB — CBC WITH DIFFERENTIAL/PLATELET
Basophils Absolute: 0 10*3/uL (ref 0.0–0.1)
Eosinophils Relative: 0 % (ref 0–5)
Lymphocytes Relative: 11 % — ABNORMAL LOW (ref 12–46)
Lymphs Abs: 1.4 10*3/uL (ref 0.7–4.0)
MCV: 81.9 fL (ref 78.0–100.0)
Neutro Abs: 10.4 10*3/uL — ABNORMAL HIGH (ref 1.7–7.7)
Platelets: 291 10*3/uL (ref 150–400)
RBC: 5.19 MIL/uL (ref 4.22–5.81)
RDW: 13.6 % (ref 11.5–15.5)
WBC: 12.4 10*3/uL — ABNORMAL HIGH (ref 4.0–10.5)

## 2012-09-02 MED ORDER — SODIUM CHLORIDE 0.9 % IV SOLN
INTRAVENOUS | Status: DC
  Administered 2012-09-02: 18:00:00 via INTRAVENOUS

## 2012-09-02 MED ORDER — TAMSULOSIN HCL 0.4 MG PO CAPS: 0.4000 mg | ORAL_CAPSULE | Freq: Every day | ORAL | Status: DC

## 2012-09-02 MED ORDER — IOHEXOL 300 MG/ML  SOLN
100.0000 mL | Freq: Once | INTRAMUSCULAR | Status: AC | PRN
  Administered 2012-09-02: 100 mL via INTRAVENOUS

## 2012-09-02 MED ORDER — KETOROLAC TROMETHAMINE 30 MG/ML IJ SOLN: 30.0000 mg | Freq: Once | INTRAMUSCULAR | Status: DC

## 2012-09-02 MED ORDER — SODIUM CHLORIDE 0.9 % IV BOLUS (SEPSIS)
1000.0000 mL | Freq: Once | INTRAVENOUS | Status: AC
  Administered 2012-09-02: 1000 mL via INTRAVENOUS
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
## 2012-09-02 NOTE — ED Notes (Addendum)
Pt presenting to ed with c/o abdominal pain x 2 days pt denies nausea, and vomiting pt states positive diarrhea. Pt decreased appetite. Pt denies hematuria at this time. Pt states last normal bowel movement x 2 days ago.

## 2012-09-02 NOTE — ED Notes (Signed)
Patient transported to X-ray 

## 2012-09-05 ENCOUNTER — Telehealth: Payer: Self-pay | Admitting: Family Medicine

## 2012-09-05 NOTE — Telephone Encounter (Signed)
Can you call and schedule a ER follow up visit?

## 2012-09-06 NOTE — Telephone Encounter (Signed)
appt scheduled

## 2012-09-07 DIAGNOSIS — N201 Calculus of ureter: Secondary | ICD-10-CM | POA: Diagnosis not present

## 2012-09-09 ENCOUNTER — Ambulatory Visit (INDEPENDENT_AMBULATORY_CARE_PROVIDER_SITE_OTHER): Payer: Medicare Other | Admitting: Family Medicine

## 2012-09-09 ENCOUNTER — Encounter: Payer: Self-pay | Admitting: Family Medicine

## 2012-09-09 VITALS — BP 130/80 | HR 68 | Temp 98.5°F | Wt 280.0 lb

## 2012-09-09 DIAGNOSIS — R7309 Other abnormal glucose: Secondary | ICD-10-CM

## 2012-09-09 DIAGNOSIS — R739 Hyperglycemia, unspecified: Secondary | ICD-10-CM

## 2012-09-09 DIAGNOSIS — N200 Calculus of kidney: Secondary | ICD-10-CM

## 2012-09-09 DIAGNOSIS — E785 Hyperlipidemia, unspecified: Secondary | ICD-10-CM

## 2012-09-09 LAB — BASIC METABOLIC PANEL
BUN: 12 mg/dL (ref 6–23)
Calcium: 9.3 mg/dL (ref 8.4–10.5)
GFR: 63.83 mL/min (ref >60.00)
Glucose, Bld: 82 mg/dL (ref 70–99)
Potassium: 4.4 mEq/L (ref 3.5–5.1)

## 2012-09-09 LAB — LIPID PANEL
HDL: 31.4 mg/dL — ABNORMAL LOW (ref >39.00)
LDL Cholesterol: 65 mg/dL (ref 0–99)
VLDL: 22.4 mg/dL (ref 0.0–40.0)

## 2012-09-09 LAB — HEMOGLOBIN A1C: Hgb A1c MFr Bld: 6.3 % (ref 4.6–6.5)

## 2012-09-09 NOTE — Progress Notes (Signed)
  Subjective:    Patient ID: Patrick Pearson, male    DOB: 1966-02-07, 47 y.o.   MRN: [id number]  HPI Here to follow up after an ER visit on 09-02-12 for a 4mm left ureteral stone which he was able to pass while in the ER. A CT scan showed slight hydroureter and slight hydronephrosis behind this stone. He followed up with Urology last week and they did an [place] which apparently showed these to be resolved. He has felt fine ever since with normal urinations.    Review of Systems  Constitutional: Negative.   Respiratory: Negative.   Cardiovascular: Negative.   Gastrointestinal: Negative.   Genitourinary: Negative.        Objective:   Physical Exam  Constitutional: He appears well-developed and well-nourished.  Abdominal: Soft. Bowel sounds are normal. He exhibits no distension and no mass. There is no tenderness. There is no rebound and no guarding.          Assessment & Plan:  He seems to have passed the stone. He is fasting so we will check his lipids, etc.

## 2012-09-13 NOTE — Progress Notes (Signed)
Quick Note:  I left voice message with results. ______ 

## 2012-09-16 DIAGNOSIS — S42309D Unspecified fracture of shaft of humerus, unspecified arm, subsequent encounter for fracture with routine healing: Secondary | ICD-10-CM | POA: Diagnosis not present

## 2012-10-18 DIAGNOSIS — IMO0001 Reserved for inherently not codable concepts without codable children: Secondary | ICD-10-CM | POA: Diagnosis not present

## 2012-11-11 ENCOUNTER — Telehealth: Payer: Self-pay | Admitting: Family Medicine

## 2012-11-11 MED ORDER — ROSUVASTATIN CALCIUM 40 MG PO TABS: 40.0000 mg | ORAL_TABLET | Freq: Every day | ORAL | Status: DC

## 2012-11-11 NOTE — Telephone Encounter (Signed)
Refill request for crestor and I did send e-scribe.

## 2012-11-15 ENCOUNTER — Telehealth: Payer: Self-pay | Admitting: Family Medicine

## 2012-11-15 NOTE — Telephone Encounter (Signed)
RN attempted to return call regarding Crestor.  Identified message left on voice mail.

## 2012-11-16 NOTE — Telephone Encounter (Signed)
No follow up required, closing encounter. °

## 2012-11-23 ENCOUNTER — Other Ambulatory Visit (HOSPITAL_COMMUNITY): Payer: Medicare Other

## 2012-11-23 ENCOUNTER — Ambulatory Visit: Payer: Medicare Other | Admitting: Cardiology

## 2012-12-23 ENCOUNTER — Other Ambulatory Visit (HOSPITAL_COMMUNITY): Payer: Self-pay | Admitting: Radiology

## 2012-12-23 DIAGNOSIS — I350 Nonrheumatic aortic (valve) stenosis: Secondary | ICD-10-CM

## 2012-12-26 ENCOUNTER — Ambulatory Visit (HOSPITAL_COMMUNITY): Payer: Medicare Other | Attending: Cardiology | Admitting: Radiology

## 2012-12-26 ENCOUNTER — Ambulatory Visit (INDEPENDENT_AMBULATORY_CARE_PROVIDER_SITE_OTHER): Payer: Medicare Other | Admitting: Cardiology

## 2012-12-26 ENCOUNTER — Encounter: Payer: Self-pay | Admitting: Cardiology

## 2012-12-26 VITALS — BP 160/69 | HR 51 | Ht 74.0 in | Wt 286.0 lb

## 2012-12-26 DIAGNOSIS — I350 Nonrheumatic aortic (valve) stenosis: Secondary | ICD-10-CM

## 2012-12-26 DIAGNOSIS — I359 Nonrheumatic aortic valve disorder, unspecified: Secondary | ICD-10-CM | POA: Insufficient documentation

## 2012-12-26 DIAGNOSIS — R55 Syncope and collapse: Secondary | ICD-10-CM | POA: Insufficient documentation

## 2012-12-26 DIAGNOSIS — R0989 Other specified symptoms and signs involving the circulatory and respiratory systems: Secondary | ICD-10-CM

## 2012-12-26 DIAGNOSIS — G4733 Obstructive sleep apnea (adult) (pediatric): Secondary | ICD-10-CM | POA: Insufficient documentation

## 2012-12-26 DIAGNOSIS — E785 Hyperlipidemia, unspecified: Secondary | ICD-10-CM | POA: Insufficient documentation

## 2012-12-26 DIAGNOSIS — R0609 Other forms of dyspnea: Secondary | ICD-10-CM | POA: Diagnosis not present

## 2012-12-26 DIAGNOSIS — Z87891 Personal history of nicotine dependence: Secondary | ICD-10-CM | POA: Insufficient documentation

## 2012-12-26 NOTE — Progress Notes (Signed)
Echocardiogram performed.  

## 2012-12-26 NOTE — Patient Instructions (Addendum)
The current medical regimen is effective;  continue present plan and medications.  Follow up in 1 year with Dr Antoine Poche.  You will receive a letter in the mail 2 months before you are due.  Please call us when you receive this letter to schedule your follow up appointment.  Aortic Stenosis Aortic stenosis, or aortic valve stenosis, is a narrowing of the aortic valve. When the aortic valve is narrowed, the valve does not open and close very well. This restricts blood flow between the left side of the heart and the aorta (the large artery which takes blood to the rest of the body). This restriction makes it hard for your heart to pump blood. This extra work can weaken your heart and can lead to heart failure. CAUSES  Causes of aortic valve stenosis can vary. Some of these can include:  Calcium deposits on the aortic valve. Calcium can buildup on the aortic valve and make it stiff. This cause of aortic stenosis is most common in people over the age of 81.  Congenital heart defect. This can occur during the development of the fetus and can result in an aortic valve defect.  Rheumatic fever. Rheumatic fever is a bacterial infection that can develop from a strep throat infection. The bacteria from rheumatic fever can attach themselves to the valve. This can cause scarring on the aortic valve, causing it to become narrow. SYMPTOMS  Symptoms of aortic valve stenosis develop when the valve disease is severe. Symptoms can include:  Shortness of breath, especially with physical activity.  Feeling tired (fatigue).  Chest pain (angina) or tightness.  Feeing your heart race or beat funny (heart palpitations).  Dizziness or fainting. DIAGNOSIS  Aortic stenosis is diagnosed through:  A physical exam and symptoms.  A heart murmur.  Echocardiography. This test uses sound waves to produce images of your heart. TREATMENT   Surgery is the treatment for aortic valve stenosis.  Surgery may not be  needed right away. Surgery is necessary when narrowing of the aortic valve becomes severe, and symptoms develop or become worse.  Medications cannot reverse aortic valve stenosis. HOME CARE INSTRUCTIONS   If you have aortic stenosis, you many need to avoid strenuous physical activity. Talk with your caregiver about what types of activities you should avoid.  If you are a woman with aortic valve stenosis and are of child-bearing age, talk to your caregiver before you become pregnant.  If you become pregnant, you will need to be monitored by your obstetrician and cardiologist throughout your pregnancy, labor and delivery, and after delivery. SEEK IMMEDIATE MEDICAL CARE IF:  You develop chest pain or tightness.  You develop shortness of breath or difficulty breathing.  You develop lightheadedness or fainting.  You have heart palpitations or skipped heartbeats. Document Released: 05/16/2003 Document Revised: 11/09/2011 Document Reviewed: 12/24/2009 [facility] Patient Information 2013 [place], [place].
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
## 2013-01-03 ENCOUNTER — Telehealth: Payer: Self-pay | Admitting: *Deleted

## 2013-01-03 NOTE — Telephone Encounter (Signed)
Message copied by Tarri Fuller on Tue Jan 03, 2013 12:46 PM ------      Message from: Rollene Rotunda      Created: Tue Dec 27, 2012  9:33 PM       Moderate AS.  I will see him again in one year. Discussed results with the patient at the appt. ------

## 2013-01-03 NOTE — Telephone Encounter (Signed)
lmom see that Dr. Antoine Poche already went over echo results at [facility] with the pt the end of April. Dr. Antoine Poche states he will see pt in 1 yr.

## 2013-01-29 ENCOUNTER — Other Ambulatory Visit: Payer: Self-pay | Admitting: Family Medicine

## 2013-02-07 ENCOUNTER — Telehealth: Payer: Self-pay | Admitting: Family Medicine

## 2013-02-07 ENCOUNTER — Encounter: Payer: Self-pay | Admitting: Family Medicine

## 2013-02-07 ENCOUNTER — Ambulatory Visit (INDEPENDENT_AMBULATORY_CARE_PROVIDER_SITE_OTHER): Payer: Medicare Other | Admitting: Family Medicine

## 2013-02-07 VITALS — BP 160/100 | HR 72 | Temp 98.5°F | Wt 284.0 lb

## 2013-02-07 DIAGNOSIS — L03019 Cellulitis of unspecified finger: Secondary | ICD-10-CM

## 2013-02-07 DIAGNOSIS — L03012 Cellulitis of left finger: Secondary | ICD-10-CM

## 2013-02-07 MED ORDER — DOXYCYCLINE HYCLATE 100 MG PO CAPS: 100.0000 mg | ORAL_CAPSULE | Freq: Two times a day (BID) | ORAL | Status: AC

## 2013-02-07 NOTE — Telephone Encounter (Signed)
Attempted promised call back without success.  Identified message left.

## 2013-02-07 NOTE — Progress Notes (Signed)
  Subjective:    Patient ID: Patrick Pearson, male    DOB: May 12, 1966, 47 y.o.   MRN: [id number]  HPI Here for 3 days of painful swelling on the left third finger. No recent trauma.    Review of Systems  Constitutional: Negative.        Objective:   Physical Exam  Constitutional: He appears well-developed and well-nourished.  Skin:  Large tender abscessed area at the edge of the fingernail          Assessment & Plan:  The area was lanced with a scalpel and a large amount of purulent fluid was expressed. Sample was sent for culture. Wrapped with gauze. Treat with Doxycycline. Use hot soaks with Epsom salts.

## 2013-02-07 NOTE — Telephone Encounter (Signed)
Patient Information:  Caller Name: Orvilla Fus  Phone: [phone]  Patient: Patrick Pearson, Patrick Pearson  Gender: Male  DOB: Aug 30, 1966  Age: 47 Years  PCP: Gershon Crane [facility])  Office Follow Up:  Does the office need to follow up with this patient?: N/A  Instructions For The Office: N/A   Symptoms  Reason For Call & Symptoms: Pt is calling about the middle finger being swollen with a pocket of pus and a bruise behind the nail. No finger. No injury noted.  Reviewed Health History In EMR: Yes  Reviewed Medications In EMR: Yes  Reviewed Allergies In EMR: Yes  Reviewed Surgeries / Procedures: Yes  Date of Onset of Symptoms: 02/06/2013  Guideline(s) Used:  Finger Pain  Disposition Per Guideline:   See Today in Office  Reason For Disposition Reached:   Looks infected (e.g., spreading redness, pus, red streak)  Advice Given:  Appt scheduled with Dr. Clent Ridges at 11:30  Patient Will Follow Care Advice:  YES

## 2013-02-10 LAB — WOUND CULTURE

## 2013-02-10 NOTE — Progress Notes (Signed)
Quick Note:  I left voice message with results. ______ 

## 2013-04-12 ENCOUNTER — Other Ambulatory Visit: Payer: Self-pay | Admitting: Family Medicine

## 2013-06-07 ENCOUNTER — Other Ambulatory Visit: Payer: Self-pay | Admitting: Family Medicine

## 2013-06-09 DIAGNOSIS — Z23 Encounter for immunization: Secondary | ICD-10-CM | POA: Diagnosis not present

## 2013-08-14 ENCOUNTER — Telehealth: Payer: Self-pay | Admitting: Family Medicine

## 2013-08-14 ENCOUNTER — Other Ambulatory Visit (INDEPENDENT_AMBULATORY_CARE_PROVIDER_SITE_OTHER): Payer: Medicare Other

## 2013-08-14 DIAGNOSIS — E785 Hyperlipidemia, unspecified: Secondary | ICD-10-CM

## 2013-08-14 LAB — LIPID PANEL
Cholesterol: 180 mg/dL (ref 0–200)
Triglycerides: 120 mg/dL (ref 0.0–149.0)

## 2013-08-14 NOTE — Telephone Encounter (Signed)
He is here for fasting labs

## 2013-08-16 ENCOUNTER — Other Ambulatory Visit: Payer: Self-pay

## 2013-08-16 MED ORDER — ROSUVASTATIN CALCIUM 40 MG PO TABS: ORAL_TABLET | ORAL | Status: DC

## 2013-08-16 NOTE — Telephone Encounter (Signed)
Rx request for Crestor 40mg .  Rx sent to pharmacy.

## 2013-08-23 ENCOUNTER — Ambulatory Visit (INDEPENDENT_AMBULATORY_CARE_PROVIDER_SITE_OTHER): Payer: Medicare Other | Admitting: Family Medicine

## 2013-08-23 ENCOUNTER — Encounter: Payer: Self-pay | Admitting: Family Medicine

## 2013-08-23 VITALS — BP 150/80 | HR 79 | Temp 98.6°F | Wt 278.0 lb

## 2013-08-23 DIAGNOSIS — E785 Hyperlipidemia, unspecified: Secondary | ICD-10-CM

## 2013-08-23 DIAGNOSIS — R030 Elevated blood-pressure reading, without diagnosis of hypertension: Secondary | ICD-10-CM | POA: Diagnosis not present

## 2013-08-23 NOTE — Progress Notes (Signed)
   Subjective:    Patient ID: Patrick Pearson, male    DOB: 1966/03/08, 47 y.o.   MRN: [id number]  HPI Here to review his labs and his BP. He feels fine. His recent LDL had gone up a little and he thinks this is because he forgets to take the Crestor at times. He has been able to lose 6 lbs since our visit in June and his BP has come down.    Review of Systems  Constitutional: Negative.   Respiratory: Negative.   Cardiovascular: Negative.        Objective:   Physical Exam  Constitutional: He appears well-developed and well-nourished.  Cardiovascular: Normal rate, regular rhythm, normal heart sounds and intact distal pulses.   Pulmonary/Chest: Effort normal and breath sounds normal.          Assessment & Plan:  He will continue his weight loss efforts. Recheck in 6 months

## 2013-08-23 NOTE — Progress Notes (Signed)
Pre visit review using our clinic review tool, if applicable. No additional management support is needed unless otherwise documented below in the visit note. 

## 2013-12-29 ENCOUNTER — Other Ambulatory Visit: Payer: Self-pay | Admitting: Family Medicine

## 2014-01-25 ENCOUNTER — Emergency Department (HOSPITAL_COMMUNITY)
Admission: EM | Admit: 2014-01-25 | Discharge: 2014-01-25 | Disposition: A | Discharge status: Home / Self Care | Payer: Medicare Other | Attending: Emergency Medicine | Admitting: Emergency Medicine

## 2014-01-25 ENCOUNTER — Emergency Department (HOSPITAL_COMMUNITY): Payer: Medicare Other

## 2014-01-25 ENCOUNTER — Encounter (HOSPITAL_COMMUNITY): Payer: Self-pay | Admitting: Emergency Medicine

## 2014-01-25 DIAGNOSIS — Z9981 Dependence on supplemental oxygen: Secondary | ICD-10-CM | POA: Insufficient documentation

## 2014-01-25 DIAGNOSIS — Z8719 Personal history of other diseases of the digestive system: Secondary | ICD-10-CM | POA: Insufficient documentation

## 2014-01-25 DIAGNOSIS — Z87828 Personal history of other (healed) physical injury and trauma: Secondary | ICD-10-CM | POA: Diagnosis not present

## 2014-01-25 DIAGNOSIS — R011 Cardiac murmur, unspecified: Secondary | ICD-10-CM | POA: Diagnosis not present

## 2014-01-25 DIAGNOSIS — Z8659 Personal history of other mental and behavioral disorders: Secondary | ICD-10-CM | POA: Insufficient documentation

## 2014-01-25 DIAGNOSIS — E785 Hyperlipidemia, unspecified: Secondary | ICD-10-CM | POA: Insufficient documentation

## 2014-01-25 DIAGNOSIS — N529 Male erectile dysfunction, unspecified: Secondary | ICD-10-CM | POA: Diagnosis not present

## 2014-01-25 DIAGNOSIS — Y92410 Unspecified street and highway as the place of occurrence of the external cause: Secondary | ICD-10-CM | POA: Insufficient documentation

## 2014-01-25 DIAGNOSIS — S0083XA Contusion of other part of head, initial encounter: Secondary | ICD-10-CM | POA: Insufficient documentation

## 2014-01-25 DIAGNOSIS — Y9389 Activity, other specified: Secondary | ICD-10-CM | POA: Insufficient documentation

## 2014-01-25 DIAGNOSIS — Z87891 Personal history of nicotine dependence: Secondary | ICD-10-CM | POA: Diagnosis not present

## 2014-01-25 DIAGNOSIS — G4730 Sleep apnea, unspecified: Secondary | ICD-10-CM | POA: Diagnosis not present

## 2014-01-25 DIAGNOSIS — IMO0002 Reserved for concepts with insufficient information to code with codable children: Secondary | ICD-10-CM | POA: Insufficient documentation

## 2014-01-25 DIAGNOSIS — S0003XA Contusion of scalp, initial encounter: Secondary | ICD-10-CM | POA: Diagnosis not present

## 2014-01-25 DIAGNOSIS — S1093XA Contusion of unspecified part of neck, initial encounter: Secondary | ICD-10-CM | POA: Diagnosis not present

## 2014-01-25 DIAGNOSIS — T07XXXA Unspecified multiple injuries, initial encounter: Secondary | ICD-10-CM

## 2014-01-25 DIAGNOSIS — S41109A Unspecified open wound of unspecified upper arm, initial encounter: Secondary | ICD-10-CM | POA: Diagnosis not present

## 2014-01-25 DIAGNOSIS — Z8679 Personal history of other diseases of the circulatory system: Secondary | ICD-10-CM | POA: Diagnosis not present

## 2014-01-25 DIAGNOSIS — S0993XA Unspecified injury of face, initial encounter: Secondary | ICD-10-CM | POA: Diagnosis not present

## 2014-01-25 DIAGNOSIS — S298XXA Other specified injuries of thorax, initial encounter: Secondary | ICD-10-CM | POA: Diagnosis not present

## 2014-01-25 DIAGNOSIS — Z87820 Personal history of traumatic brain injury: Secondary | ICD-10-CM | POA: Insufficient documentation

## 2014-01-25 DIAGNOSIS — R6884 Jaw pain: Secondary | ICD-10-CM | POA: Diagnosis not present

## 2014-01-25 DIAGNOSIS — Z79899 Other long term (current) drug therapy: Secondary | ICD-10-CM | POA: Diagnosis not present

## 2014-01-25 DIAGNOSIS — S199XXA Unspecified injury of neck, initial encounter: Secondary | ICD-10-CM | POA: Diagnosis not present

## 2014-01-25 MED ORDER — HYDROCODONE-ACETAMINOPHEN 5-325 MG PO TABS: 1.0000 | ORAL_TABLET | Freq: Four times a day (QID) | ORAL | Status: DC | PRN

## 2014-01-25 MED ORDER — HYDROCODONE-ACETAMINOPHEN 5-325 MG PO TABS
1.0000 | ORAL_TABLET | Freq: Once | ORAL | Status: AC
  Administered 2014-01-25: 1 via ORAL
  Filled 2014-01-25: qty 1

## 2014-01-25 NOTE — ED Notes (Signed)
Family at bedside and updated

## 2014-01-25 NOTE — ED Provider Notes (Signed)
Medical screening examination/treatment/procedure(s) were performed by non-physician practitioner and as supervising physician I was immediately available for consultation/collaboration.   EKG Interpretation None        Anant Agard, MD 01/25/14 2018 
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
## 2014-01-25 NOTE — Discharge Instructions (Signed)
Mandibular Contusion A mandibular contusion is a deep bruise of your jaw. Contusions are the result of an injury that caused bleeding under the skin. The contusion may turn blue, purple, or yellow. Minor injuries will give you a painless contusion, but more severe contusions may stay painful and swollen for a few weeks.  CAUSES A mandibular contusion comes from a direct force to that area, such as falling or a punch to the jaw. SYMPTOMS   Jaw pain.  Jaw swelling.  Jaw bruising.  Jaw tenderness. DIAGNOSIS  The diagnosis can be made by taking your history and doing a physical exam. You may need an X-ray of your jaw to look for a broken bone (fracture). TREATMENT Often, the best treatment for a mandibular contusion is applying cold compresses to the injured area and eating a soft diet. Over-the-counter medicines may also be recommended for pain control.  HOME CARE INSTRUCTIONS   Put ice on the injured area.  Put ice in a plastic bag.  Place a towel between your skin and the bag.  Leave the ice on for 15-20 minutes, 03-04 times a day.  Eat soft foods for 1 week. Soft foods include baby food, gelatin, cooked cereal, ice cream, applesauce, bananas, eggs, pasta, cottage cheese, soups, and yogurt. Cut food into smaller pieces for less chewing. Avoid chewing gum or ice.  Only take over-the-counter or prescription medicines for pain, discomfort, or fever as directed by your caregiver.  Avoid opening your mouth widely. This includes opening your mouth to eat large pieces of food or to yawn, scream, yell, or sing. SEEK IMMEDIATE MEDICAL CARE IF:   Your swelling or pain is not relieved with medicines.  You are not improving.  You have any cracking or clicking (crepitation) in the jaw joint. MAKE SURE YOU:   Understand these instructions.  Will watch your condition.  Will get help right away if you are not doing well or get worse. Document Released: 11/07/2003 Document Revised:  11/09/2011 Document Reviewed: 07/03/2011 [facility] Patient Information 2014 [place], [place].
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
## 2014-01-30 ENCOUNTER — Telehealth: Payer: Self-pay | Admitting: Family Medicine

## 2014-01-30 DIAGNOSIS — E785 Hyperlipidemia, unspecified: Secondary | ICD-10-CM

## 2014-01-30 NOTE — Telephone Encounter (Signed)
Pt would like come in and have chole test. Can I sch?

## 2014-01-30 NOTE — Telephone Encounter (Signed)
Orders were put in.

## 2014-01-31 NOTE — Telephone Encounter (Signed)
Can you call pt to schedule the lab appointment? 

## 2014-01-31 NOTE — Telephone Encounter (Signed)
Pt has been sch

## 2014-02-01 ENCOUNTER — Other Ambulatory Visit (INDEPENDENT_AMBULATORY_CARE_PROVIDER_SITE_OTHER): Payer: Medicare Other

## 2014-02-01 ENCOUNTER — Telehealth: Payer: Self-pay | Admitting: Family Medicine

## 2014-02-01 DIAGNOSIS — E785 Hyperlipidemia, unspecified: Secondary | ICD-10-CM

## 2014-02-01 DIAGNOSIS — E119 Type 2 diabetes mellitus without complications: Secondary | ICD-10-CM

## 2014-02-01 LAB — HEPATIC FUNCTION PANEL
ALT: 45 U/L (ref 0–53)
AST: 36 U/L (ref 0–37)
Albumin: 4.1 g/dL (ref 3.5–5.2)
Alkaline Phosphatase: 60 U/L (ref 39–117)
BILIRUBIN TOTAL: 0.4 mg/dL (ref 0.2–1.2)
Bilirubin, Direct: 0 mg/dL (ref 0.0–0.3)
Total Protein: 6.9 g/dL (ref 6.0–8.3)

## 2014-02-01 LAB — LIPID PANEL
CHOL/HDL RATIO: 3
CHOLESTEROL: 130 mg/dL (ref 0–200)
HDL: 39.1 mg/dL (ref >39.00)
LDL Cholesterol: 70 mg/dL (ref 0–99)
NONHDL: 90.9
Triglycerides: 106 mg/dL (ref 0.0–149.0)
VLDL: 21.2 mg/dL (ref 0.0–40.0)

## 2014-02-01 NOTE — Telephone Encounter (Signed)
done

## 2014-02-01 NOTE — Telephone Encounter (Signed)
Patient wanted to schedule another lab appointment to have his cholesterol checked. I asked Dr. Sarajane Jews if it was okay and he said to schedule an appointment for him in 3 months. If you could please attach the lab orders to his appointment on May 04, 2014 at 8:30am. Thank you.

## 2014-04-25 ENCOUNTER — Telehealth: Payer: Self-pay | Admitting: Family Medicine

## 2014-04-25 NOTE — Telephone Encounter (Signed)
Refill request from [facility] for Crestor.

## 2014-04-26 MED ORDER — ROSUVASTATIN CALCIUM 40 MG PO TABS: 40.0000 mg | ORAL_TABLET | Freq: Every day | ORAL | Status: DC

## 2014-04-26 NOTE — Telephone Encounter (Signed)
Should pt be taking this medication?

## 2014-04-26 NOTE — Telephone Encounter (Signed)
I sent script e-scribe. 

## 2014-04-26 NOTE — Telephone Encounter (Signed)
Yes he takes 40 mg a day. Please refill for a year if he needs it

## 2014-05-01 ENCOUNTER — Telehealth: Payer: Self-pay | Admitting: Family Medicine

## 2014-05-01 NOTE — Telephone Encounter (Signed)
°  Caller: Patrick Pearson/Patient; Phone: [phone]; Reason for Call: Patient requests refill for Crestor 40 to be sent to [facility] (mail order/ online - only one listed in options).  Reviewed EMR and found that Rx was sent to [facility] 04/26/14.  Please follow up regarding same.  Thank you.

## 2014-05-01 NOTE — Telephone Encounter (Signed)
Please call patient regarding this to follow up; thank you.

## 2014-05-02 NOTE — Telephone Encounter (Signed)
I spoke with pt and explained that the request came from [facility] and that is where it was sent. Also I called in a local 30 day supply of Crestor while he is waiting on mail order.

## 2014-05-04 ENCOUNTER — Other Ambulatory Visit (INDEPENDENT_AMBULATORY_CARE_PROVIDER_SITE_OTHER): Payer: Medicare Other

## 2014-05-04 ENCOUNTER — Telehealth: Payer: Self-pay | Admitting: Family Medicine

## 2014-05-04 DIAGNOSIS — E785 Hyperlipidemia, unspecified: Secondary | ICD-10-CM | POA: Diagnosis not present

## 2014-05-04 LAB — LIPID PANEL
CHOL/HDL RATIO: 5
CHOLESTEROL: 164 mg/dL (ref 0–200)
HDL: 29.9 mg/dL — ABNORMAL LOW (ref >39.00)
LDL Cholesterol: 100 mg/dL — ABNORMAL HIGH (ref 0–99)
NonHDL: 134.1
TRIGLYCERIDES: 170 mg/dL — AB (ref 0.0–149.0)
VLDL: 34 mg/dL (ref 0.0–40.0)

## 2014-05-04 IMAGING — CT CT ABD-PELV W/ CM
1 of 3 series · 14 of 32 positions shown, 19 images · IV contrast (OMNIPAQUE 300)
Comparison: Radiographs obtained earlier today.

CLINICAL DATA: Abdominal pain for the past 2 days.  Diarrhea.

CT ABDOMEN AND PELVIS WITH CONTRAST
TECHNIQUE: Multidetector CT imaging of the abdomen and pelvis was
performed following the standard protocol during bolus
administration of intravenous contrast.
Contrast: 100mL OMNIPAQUE IOHEXOL 300 MG/ML  SOLN

[Series 2: abd/pel with · axial · 0.98mm/px · z∈[+1341,+1786]mm · 14 of 99 slices shown, 19 images]
[im 5/99  soft-tissue]
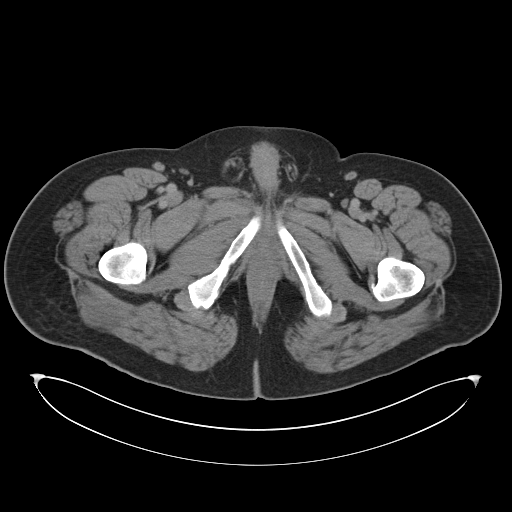
[im 5/99  bone]
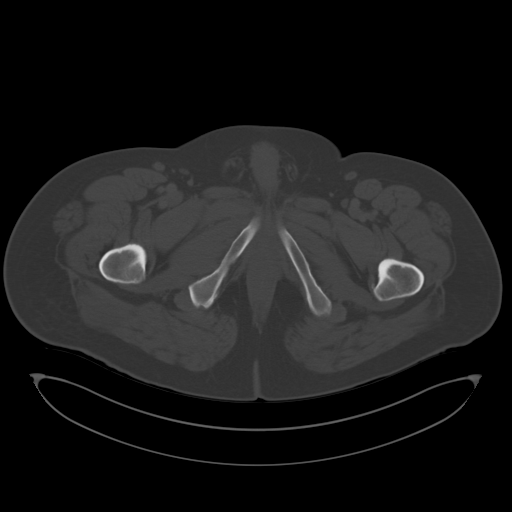
[im 15/99  soft-tissue]
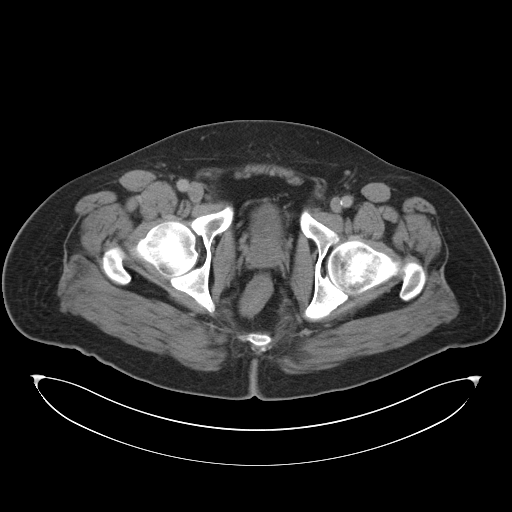
[im 20/99  soft-tissue]
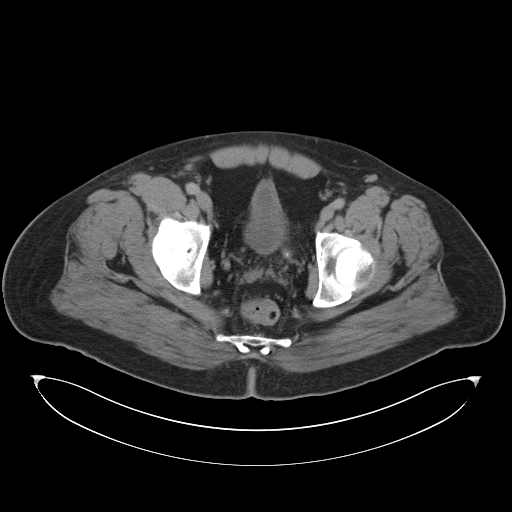
[im 30/99  soft-tissue]
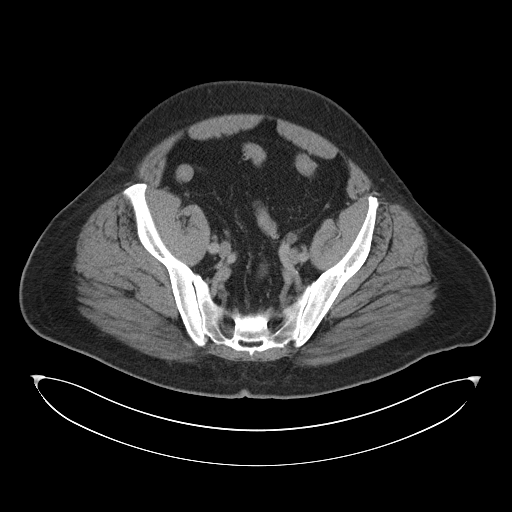
[im 35/99  soft-tissue]
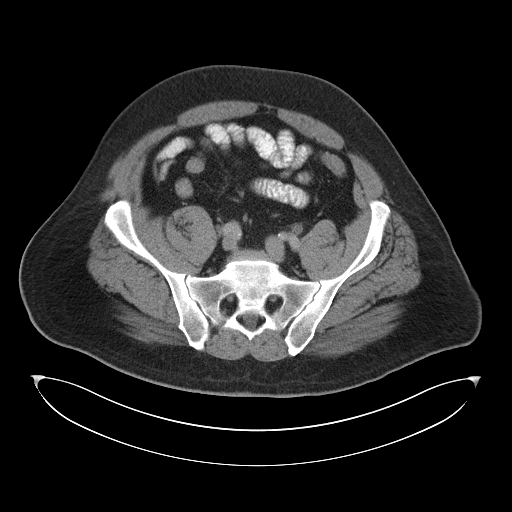
[im 45/99  soft-tissue]
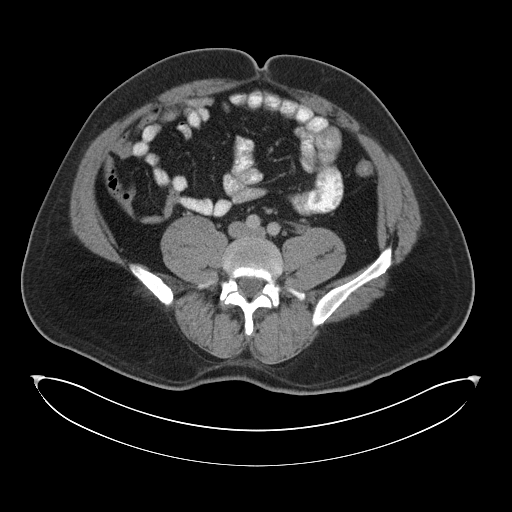
[im 50/99  soft-tissue]
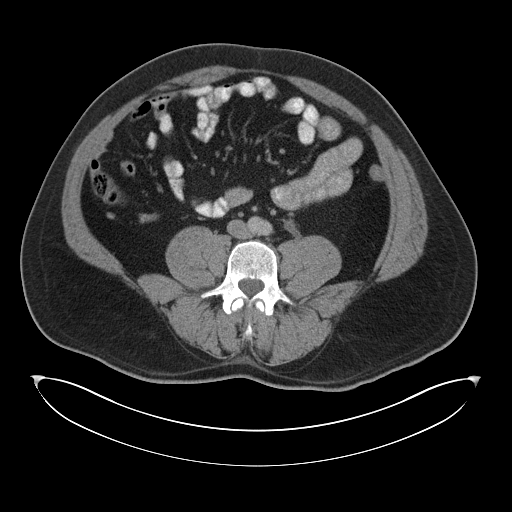
[im 54/99  soft-tissue]
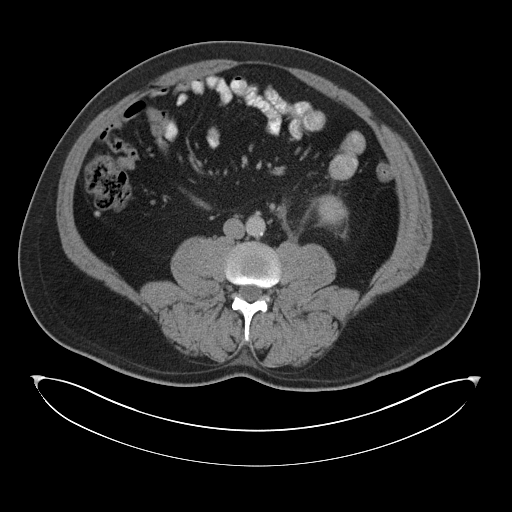
[im 64/99  soft-tissue]
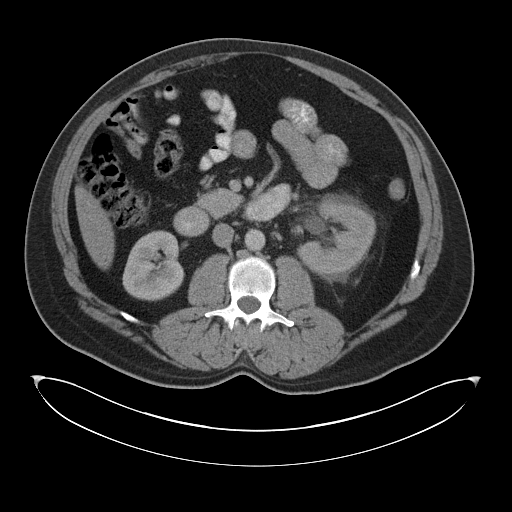
[im 64/99  bone]
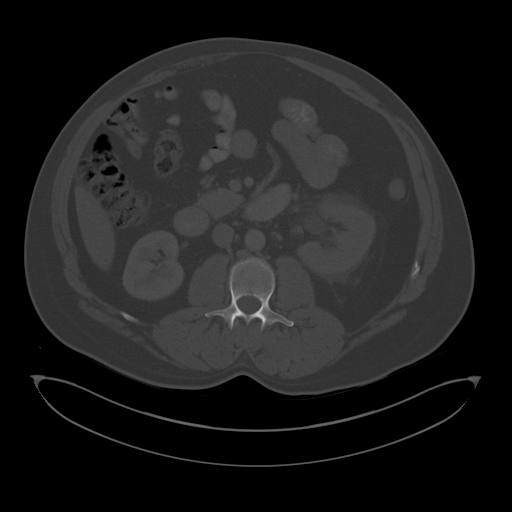
[im 69/99  soft-tissue]
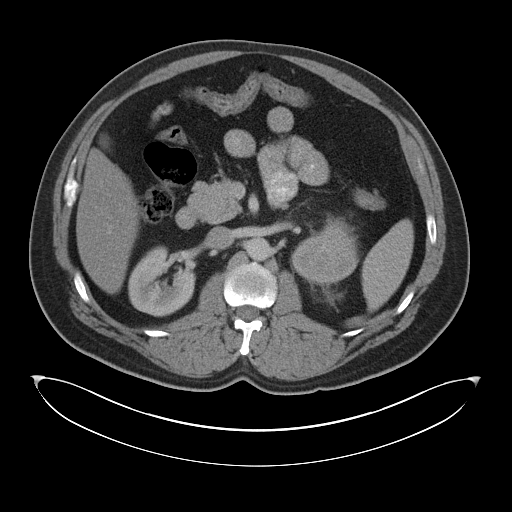
[im 79/99  soft-tissue]
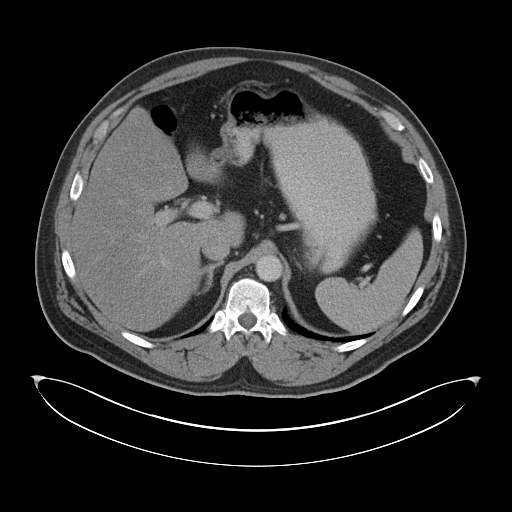
[im 79/99  lung]
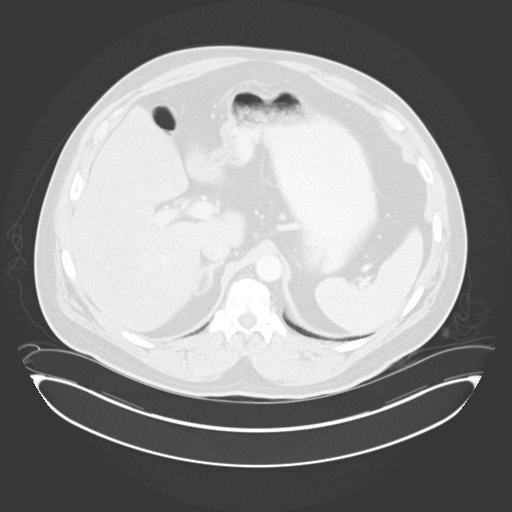
[im 84/99  soft-tissue]
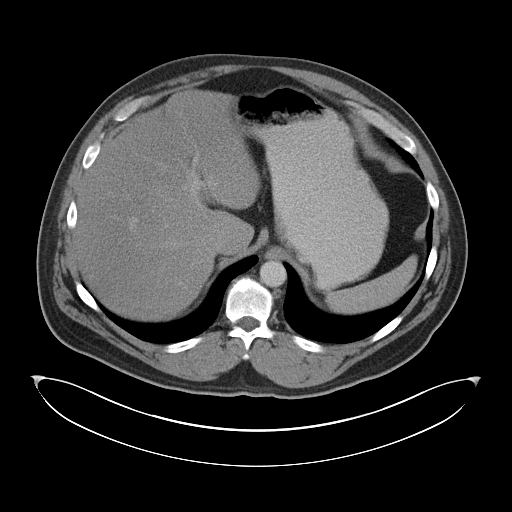
[im 84/99  lung]
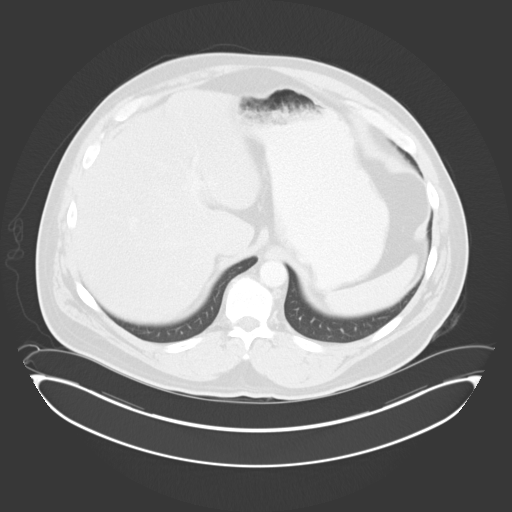
[im 89/99  lung]
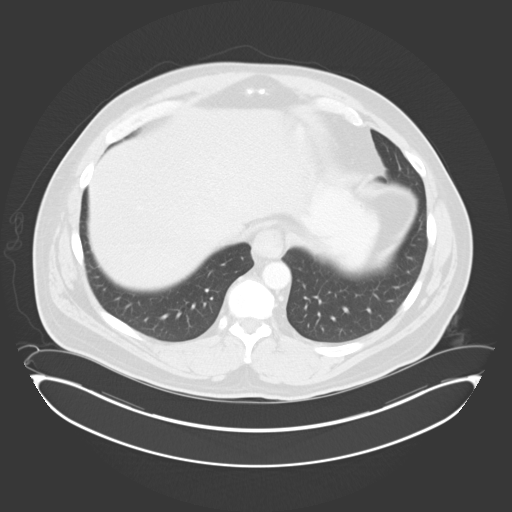
[im 94/99  soft-tissue]
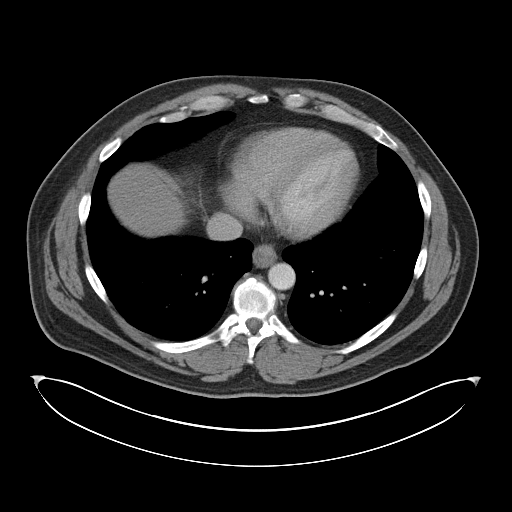
[im 94/99  lung]
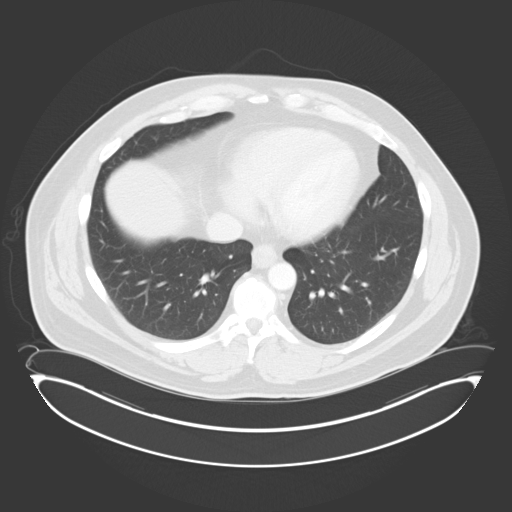

[14 of 32 positions shown; findings below may reference images not displayed]

FINDINGS: Mild to moderate dilatation of the left renal collecting
system and ureter to the level of a 4 mm calculus in the distal
ureter, just proximal to the ureteral vesicle junction.  There is
associated left perinephric and periureteric soft tissue stranding.
The calculus is not visible on the scout image or on the abdomen
radiographs obtained earlier today.  This is above the level of a
small left pelvic phlebolith seen on the scout image and
radiographs.

Multiple colonic diverticula without evidence of diverticulitis.
Normal appearing appendix in the right abdomen.  This is retrocecal
in location.

Diffuse low density of the liver relative to the spleen.  Normal
appearing spleen, pancreas, gallbladder, adrenal glands, right
kidney and urinary bladder.  The bladder is poorly distended.

Small right inguinal hernia containing fat.  Clear lung bases.
Bilateral L5 pars interarticularis defects without
spondylolisthesis.
IMPRESSION: 1.  4 mm distal left ureteral calculus causing mild to moderate
left hydronephrosis and hydroureter.
2.  Colonic diverticulosis.
3.  Hepatic steatosis.
4.  Bilateral L5 spondylolysis.
5.  Small right inguinal hernia containing fat.

## 2014-05-04 IMAGING — CR DG ABDOMEN ACUTE W/ 1V CHEST
5 series · 5 of 5 positions shown · non-contrast
Comparison: No prior abdominal imaging.  Two-view chest x-ray
11/03/2011 [REDACTED].

CLINICAL DATA: Left lower quadrant abdominal pain.  Nausea.

ACUTE ABDOMEN SERIES (ABDOMEN 2 VIEW & CHEST 1 VIEW)

[w chest pa]
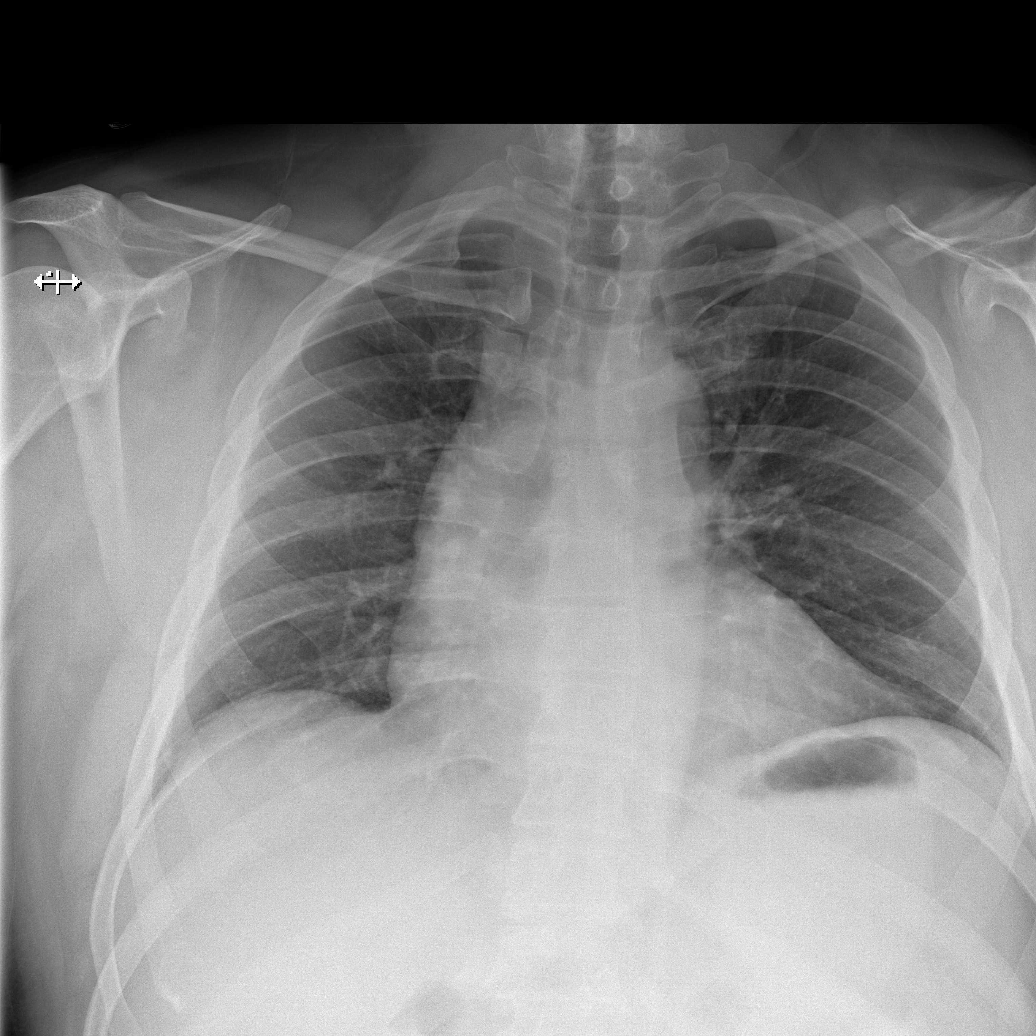

[w abdomen upright]
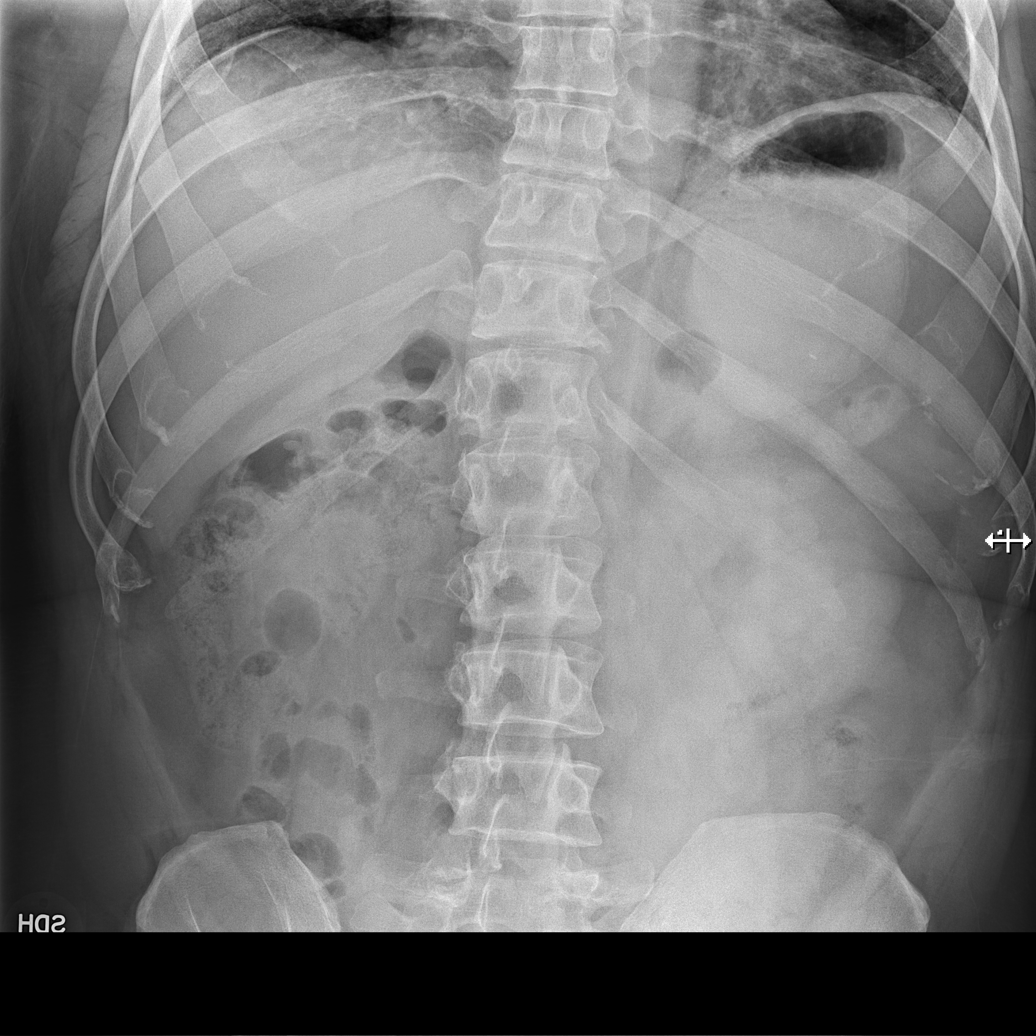

[t abdomen supine (1 of 3)]
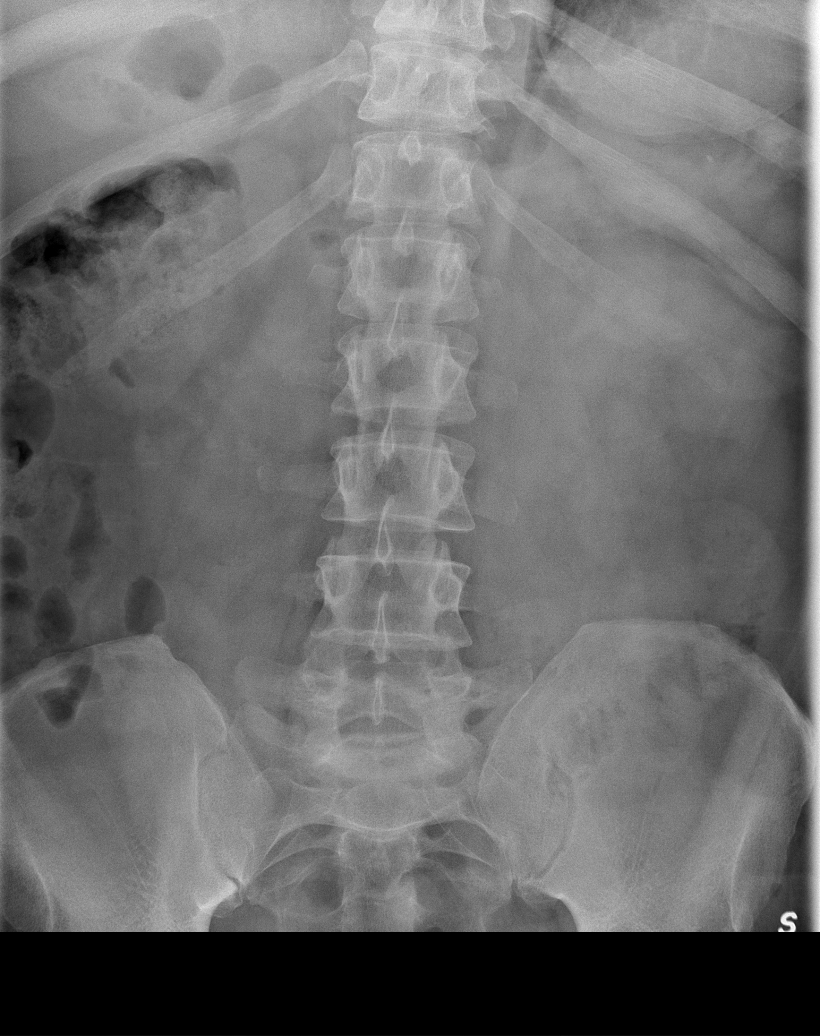

[t abdomen supine (2 of 3)]
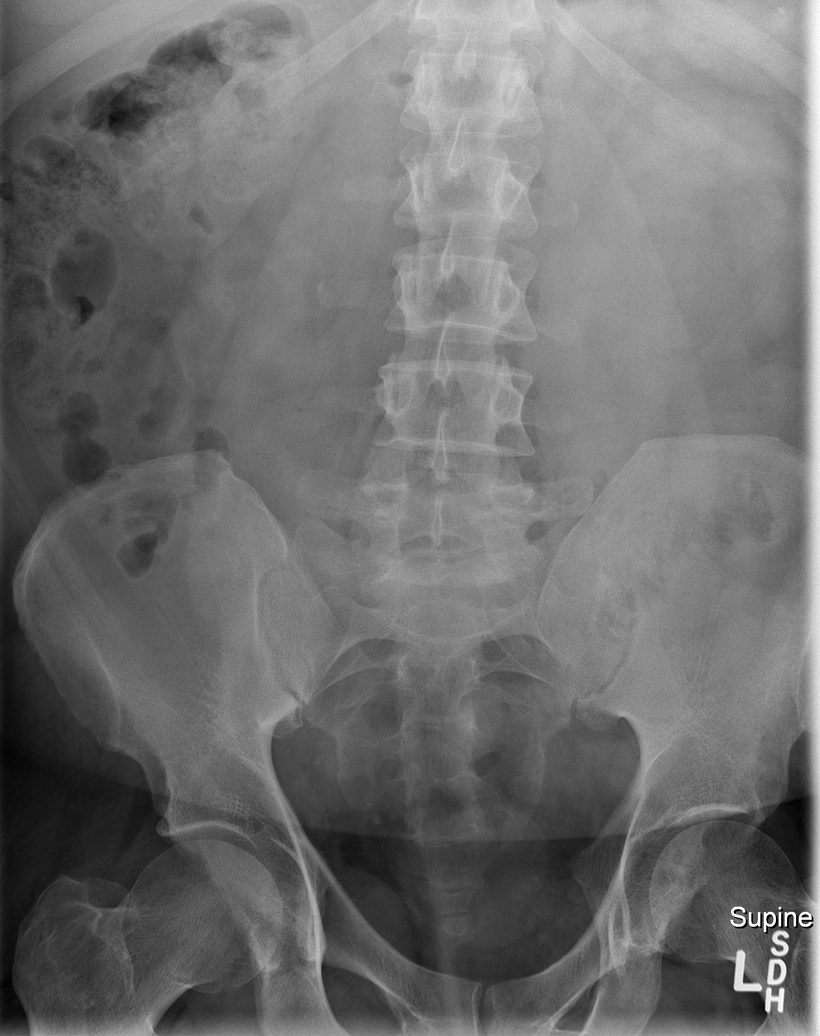

[t abdomen supine (3 of 3)]
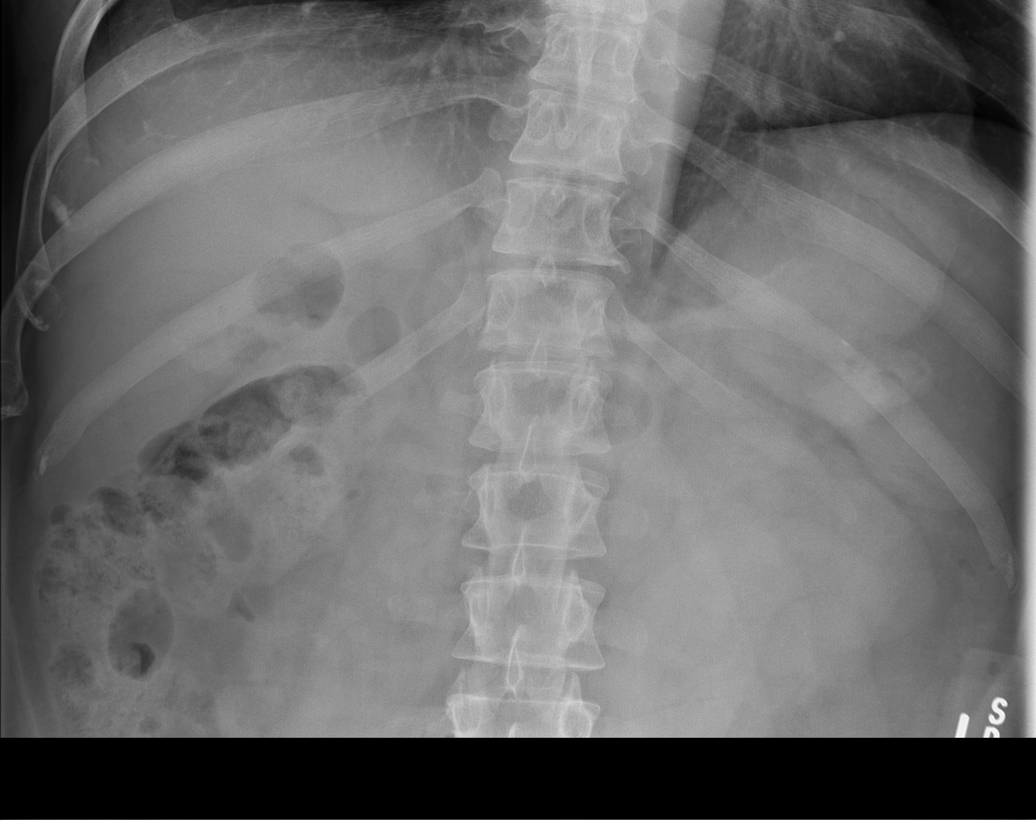

[5 of 5 positions shown; findings below may reference images not displayed]

FINDINGS: Bowel gas pattern unremarkable without evidence of
obstruction or significant ileus.  No evidence of free air or
significant air fluid levels on the erect image.  Expected stool
burden in the colon.  Phleboliths in both sides of the pelvis; no
visible opaque urinary tract calculi.  Regional skeleton
unremarkable.

Cardiac silhouette upper normal in size but stable.  Lungs clear.
Bronchovascular markings normal.  Pulmonary vascularity normal.  No
pneumothorax.  No pleural effusions.
IMPRESSION: No acute abdominal or pulmonary abnormality.

## 2014-05-04 NOTE — Telephone Encounter (Signed)
Pt inquiring if PCP agrees to allow him to switch his Crestor to Lipitor due to cost.  Patient wants to make sure the benefits of Lipitor will be same as the Crestor, if not he would rather stay on the Crestor.

## 2014-05-04 NOTE — Telephone Encounter (Signed)
Patient informed. 

## 2014-05-04 NOTE — Telephone Encounter (Signed)
He should remain on the Crestor because this is stronger than the highest dose of Lipitor that he could take

## 2014-05-15 ENCOUNTER — Ambulatory Visit: Payer: Medicare Other | Admitting: Family Medicine

## 2014-05-18 ENCOUNTER — Encounter: Payer: Self-pay | Admitting: Family Medicine

## 2014-05-18 ENCOUNTER — Ambulatory Visit (INDEPENDENT_AMBULATORY_CARE_PROVIDER_SITE_OTHER): Payer: Medicare Other | Admitting: Family Medicine

## 2014-05-18 VITALS — BP 150/88 | Temp 98.7°F | Ht 73.0 in | Wt 277.0 lb

## 2014-05-18 DIAGNOSIS — R030 Elevated blood-pressure reading, without diagnosis of hypertension: Secondary | ICD-10-CM | POA: Diagnosis not present

## 2014-05-18 DIAGNOSIS — E785 Hyperlipidemia, unspecified: Secondary | ICD-10-CM | POA: Diagnosis not present

## 2014-05-18 DIAGNOSIS — IMO0001 Reserved for inherently not codable concepts without codable children: Secondary | ICD-10-CM

## 2014-05-18 DIAGNOSIS — Z23 Encounter for immunization: Secondary | ICD-10-CM

## 2014-05-18 DIAGNOSIS — I10 Essential (primary) hypertension: Secondary | ICD-10-CM | POA: Insufficient documentation

## 2014-05-18 HISTORY — DX: Reserved for inherently not codable concepts without codable children: IMO0001

## 2014-05-18 NOTE — Progress Notes (Signed)
Pre visit review using our clinic review tool, if applicable. No additional management support is needed unless otherwise documented below in the visit note. 

## 2014-05-18 NOTE — Progress Notes (Signed)
   Subjective:    Patient ID: Patrick Pearson, male    DOB: 1966/06/29, 48 y.o.   MRN: [id number]  HPI Here to follow up. He feels well. His BP at home is stable in the 130-140 over 80s range. He rides his bicycle almost daily.    Review of Systems  Constitutional: Negative.   Respiratory: Negative.   Cardiovascular: Negative.        Objective:   Physical Exam  Constitutional: He appears well-developed and well-nourished.  Cardiovascular: Normal rate, regular rhythm, normal heart sounds and intact distal pulses.   Pulmonary/Chest: Effort normal and breath sounds normal.          Assessment & Plan:  Continue current regimen. Recheck 6 months

## 2015-01-04 ENCOUNTER — Encounter: Payer: Self-pay | Admitting: Family Medicine

## 2015-01-04 ENCOUNTER — Ambulatory Visit (INDEPENDENT_AMBULATORY_CARE_PROVIDER_SITE_OTHER): Payer: Medicare Other | Admitting: Family Medicine

## 2015-01-04 VITALS — BP 161/93 | HR 67 | Temp 98.6°F | Ht 73.0 in | Wt 289.0 lb

## 2015-01-04 DIAGNOSIS — I10 Essential (primary) hypertension: Secondary | ICD-10-CM

## 2015-01-04 MED ORDER — LISINOPRIL-HYDROCHLOROTHIAZIDE 20-25 MG PO TABS: 1.0000 | ORAL_TABLET | Freq: Every day | ORAL | Status: DC

## 2015-01-04 NOTE — Progress Notes (Signed)
   Subjective:    Patient ID: Patrick Pearson, male    DOB: 1966/07/09, 49 y.o.   MRN: [id number]  HPI Here to check his BP. When he was here last September his BP was 150/88. Then yesterday when attempting to donate blood at the [facility] it was 163/98. He feels fine but admits that his weight is up.    Review of Systems  Constitutional: Negative.   Respiratory: Negative.   Cardiovascular: Negative.        Objective:   Physical Exam  Constitutional: He appears well-developed and well-nourished.  Neck: No thyromegaly present.  Cardiovascular: Normal rate, regular rhythm, normal heart sounds and intact distal pulses.   Pulmonary/Chest: Effort normal and breath sounds normal.  Lymphadenopathy:    He has no cervical adenopathy.          Assessment & Plan:  Newly diagnosed HTN. We will draw a BMET and TSH today. Start on Linsinopril HCT. He will exercise and try to lose weight. Recheck one month

## 2015-01-04 NOTE — Progress Notes (Signed)
Pre visit review using our clinic review tool, if applicable. No additional management support is needed unless otherwise documented below in the visit note. 

## 2015-01-08 ENCOUNTER — Other Ambulatory Visit (INDEPENDENT_AMBULATORY_CARE_PROVIDER_SITE_OTHER): Payer: Medicare Other

## 2015-01-08 DIAGNOSIS — E119 Type 2 diabetes mellitus without complications: Secondary | ICD-10-CM

## 2015-01-08 LAB — LIPID PANEL
CHOL/HDL RATIO: 4
Cholesterol: 115 mg/dL (ref 0–200)
HDL: 30.3 mg/dL — ABNORMAL LOW (ref >39.00)
LDL CALC: 69 mg/dL (ref 0–99)
NonHDL: 84.7
TRIGLYCERIDES: 80 mg/dL (ref 0.0–149.0)
VLDL: 16 mg/dL (ref 0.0–40.0)

## 2015-01-08 LAB — HEPATIC FUNCTION PANEL
ALT: 39 U/L (ref 0–53)
AST: 30 U/L (ref 0–37)
Albumin: 4.1 g/dL (ref 3.5–5.2)
Alkaline Phosphatase: 60 U/L (ref 39–117)
BILIRUBIN TOTAL: 0.4 mg/dL (ref 0.2–1.2)
Bilirubin, Direct: 0.1 mg/dL (ref 0.0–0.3)
Total Protein: 6.7 g/dL (ref 6.0–8.3)

## 2015-01-08 LAB — HEMOGLOBIN A1C: Hgb A1c MFr Bld: 6.2 % (ref 4.6–6.5)

## 2015-01-29 ENCOUNTER — Telehealth: Payer: Self-pay | Admitting: Pulmonary Disease

## 2015-01-29 NOTE — Telephone Encounter (Signed)
Called and spoke to pt. Pt requesting an appt for sleep/CPAP. Appt made with RA as a consult. Pt last seen in 10/2011 by [facility]. Nothing further needed at this time.

## 2015-03-01 ENCOUNTER — Ambulatory Visit (INDEPENDENT_AMBULATORY_CARE_PROVIDER_SITE_OTHER): Payer: Medicare Other | Admitting: Pulmonary Disease

## 2015-03-01 ENCOUNTER — Encounter: Payer: Self-pay | Admitting: Pulmonary Disease

## 2015-03-01 VITALS — BP 136/82 | HR 63 | Ht 73.5 in | Wt 290.0 lb

## 2015-03-01 DIAGNOSIS — G4733 Obstructive sleep apnea (adult) (pediatric): Secondary | ICD-10-CM | POA: Diagnosis not present
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
## 2015-03-01 NOTE — Patient Instructions (Signed)
We will ask [facility] to check download on your machine CPAP supplies will be renewed x 1 year

## 2015-03-01 NOTE — Assessment & Plan Note (Signed)
We will ask [facility] to check download on your machine CPAP supplies will be renewed x 1 year  Weight loss encouraged, compliance with goal of at least 4-6 hrs every night is the expectation. Advised against medications with sedative side effects Cautioned against driving when sleepy - understanding that sleepiness will vary on a day to day basis

## 2015-03-12 DIAGNOSIS — D485 Neoplasm of uncertain behavior of skin: Secondary | ICD-10-CM | POA: Diagnosis not present

## 2015-03-14 ENCOUNTER — Telehealth: Payer: Self-pay | Admitting: Pulmonary Disease

## 2015-03-14 DIAGNOSIS — G4733 Obstructive sleep apnea (adult) (pediatric): Secondary | ICD-10-CM

## 2015-03-14 NOTE — Telephone Encounter (Signed)
Spoke to patient, patient says that he has 2 CPAP machines that he uses, and he would like to replace them both.  He is not sure if his [employer] will pay for 2 CPAP machines.  He had to purchase the other one last time he got the machines.  Patient received the Compact machine 10 years ago and using it now because his other machine is broken.  Per Jonni Sanger, both machines need to be replaced.  Jonni Sanger says patient is currently on 15cmp.  Order entered for both machines to be replaced per Andy's recommendations and per Patient's preference. Patient notified that order has been placed. Nothing further needed.

## 2015-03-14 NOTE — Telephone Encounter (Signed)
11/2014 download  - good usage  His unit is malfunctioning - OK to replace CPAP  Can we find out what settings he is on from [place] , [place]?

## 2015-03-21 ENCOUNTER — Encounter: Payer: Self-pay | Admitting: Pulmonary Disease

## 2015-04-25 ENCOUNTER — Telehealth: Payer: Self-pay | Admitting: Family Medicine

## 2015-04-25 NOTE — Telephone Encounter (Signed)
Pt needs refill on crestor 40 mg #90 w/refills [facility] mail order

## 2015-04-26 ENCOUNTER — Other Ambulatory Visit: Payer: Self-pay | Admitting: Family Medicine

## 2015-04-26 MED ORDER — LISINOPRIL-HYDROCHLOROTHIAZIDE 20-25 MG PO TABS: 1.0000 | ORAL_TABLET | Freq: Every day | ORAL | Status: DC

## 2015-04-26 NOTE — Telephone Encounter (Signed)
Lisinopril-hydochlorothiazide and crestor refilled

## 2015-04-26 NOTE — Telephone Encounter (Signed)
Script was sent e-scribe 

## 2015-05-02 ENCOUNTER — Other Ambulatory Visit: Payer: Self-pay | Admitting: Family Medicine

## 2015-05-10 ENCOUNTER — Telehealth: Payer: Self-pay | Admitting: Pulmonary Disease

## 2015-05-10 NOTE — Telephone Encounter (Signed)
On CPAP 15cm No residuals Leak ++ Can he get a better seal?

## 2015-05-14 NOTE — Telephone Encounter (Signed)
Patient called and can be reached at [phone].

## 2015-05-14 NOTE — Telephone Encounter (Signed)
lmtcb

## 2015-05-14 NOTE — Telephone Encounter (Signed)
The patient called and he can be reached at [phone].

## 2015-05-14 NOTE — Telephone Encounter (Signed)
Patient notified. Patient states that he cannot get a better seal because the mask hurts his head when he pulls it too tight.  RA - please advise

## 2015-05-22 NOTE — Telephone Encounter (Signed)
See my message below 

## 2015-05-23 NOTE — Telephone Encounter (Signed)
ok 

## 2015-05-27 ENCOUNTER — Encounter: Payer: Self-pay | Admitting: Pulmonary Disease

## 2015-06-07 ENCOUNTER — Ambulatory Visit (INDEPENDENT_AMBULATORY_CARE_PROVIDER_SITE_OTHER): Payer: Medicare Other | Admitting: Family Medicine

## 2015-06-07 DIAGNOSIS — Z23 Encounter for immunization: Secondary | ICD-10-CM

## 2015-07-22 ENCOUNTER — Ambulatory Visit (INDEPENDENT_AMBULATORY_CARE_PROVIDER_SITE_OTHER): Payer: Medicare Other | Admitting: Family Medicine

## 2015-07-22 ENCOUNTER — Encounter: Payer: Self-pay | Admitting: Family Medicine

## 2015-07-22 VITALS — BP 145/75 | HR 57 | Temp 98.8°F | Ht 73.5 in | Wt 289.0 lb

## 2015-07-22 DIAGNOSIS — R739 Hyperglycemia, unspecified: Secondary | ICD-10-CM | POA: Diagnosis not present

## 2015-07-22 DIAGNOSIS — R251 Tremor, unspecified: Secondary | ICD-10-CM | POA: Diagnosis not present

## 2015-07-22 DIAGNOSIS — I350 Nonrheumatic aortic (valve) stenosis: Secondary | ICD-10-CM

## 2015-07-22 DIAGNOSIS — IMO0001 Reserved for inherently not codable concepts without codable children: Secondary | ICD-10-CM

## 2015-07-22 LAB — BASIC METABOLIC PANEL
BUN: 18 mg/dL (ref 6–23)
CO2: 31 meq/L (ref 19–32)
Calcium: 9.6 mg/dL (ref 8.4–10.5)
Chloride: 106 mEq/L (ref 96–112)
Creatinine, Ser: 1.27 mg/dL (ref 0.40–1.50)
GFR: 64.19 mL/min (ref >60.00)
Glucose, Bld: 102 mg/dL — ABNORMAL HIGH (ref 70–99)
Potassium: 5 mEq/L (ref 3.5–5.1)
Sodium: 144 mEq/L (ref 135–145)

## 2015-07-22 LAB — HEPATIC FUNCTION PANEL
ALBUMIN: 4.3 g/dL (ref 3.5–5.2)
ALT: 31 U/L (ref 0–53)
AST: 25 U/L (ref 0–37)
Alkaline Phosphatase: 54 U/L (ref 39–117)
Bilirubin, Direct: 0.1 mg/dL (ref 0.0–0.3)
Total Bilirubin: 0.5 mg/dL (ref 0.2–1.2)
Total Protein: 6.5 g/dL (ref 6.0–8.3)

## 2015-07-22 LAB — TSH: TSH: 1.31 u[IU]/mL (ref 0.35–4.50)

## 2015-07-22 LAB — HEMOGLOBIN A1C: Hgb A1c MFr Bld: 6.2 % (ref 4.6–6.5)
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
## 2015-07-22 NOTE — Progress Notes (Signed)
Pre visit review using our clinic review tool, if applicable. No additional management support is needed unless otherwise documented below in the visit note. 

## 2015-07-23 ENCOUNTER — Telehealth: Payer: Self-pay | Admitting: Family Medicine

## 2015-07-23 LAB — CBC WITH DIFFERENTIAL/PLATELET
BASOS PCT: 0.5 % (ref 0.0–3.0)
Basophils Absolute: 0 10*3/uL (ref 0.0–0.1)
EOS PCT: 1.7 % (ref 0.0–5.0)
Eosinophils Absolute: 0.1 10*3/uL (ref 0.0–0.7)
HCT: 41.3 % (ref 39.0–52.0)
Hemoglobin: 13.4 g/dL (ref 13.0–17.0)
LYMPHS ABS: 2.6 10*3/uL (ref 0.7–4.0)
Lymphocytes Relative: 43.5 % (ref 12.0–46.0)
MCHC: 32.4 g/dL (ref 30.0–36.0)
MCV: 84.9 fl (ref 78.0–100.0)
MONOS PCT: 7.9 % (ref 3.0–12.0)
Monocytes Absolute: 0.5 10*3/uL (ref 0.1–1.0)
NEUTROS PCT: 46.4 % (ref 43.0–77.0)
Neutro Abs: 2.8 10*3/uL (ref 1.4–7.7)
Platelets: 267 10*3/uL (ref 150.0–400.0)
RBC: 4.87 Mil/uL (ref 4.22–5.81)
RDW: 14.2 % (ref 11.5–15.5)
WBC: 6 10*3/uL (ref 4.0–10.5)

## 2015-07-23 NOTE — Telephone Encounter (Signed)
Patient mother wants to know what the doctor recommend she do until they see the Neurologist on Aug 29, 2015.

## 2015-07-24 NOTE — Telephone Encounter (Signed)
I spoke with Patrick Pearson and went over below information.

## 2015-07-24 NOTE — Telephone Encounter (Signed)
There is nothing special for them to do, just rest and keep an eye on [employer]

## 2015-07-30 DIAGNOSIS — G43909 Migraine, unspecified, not intractable, without status migrainosus: Secondary | ICD-10-CM | POA: Insufficient documentation

## 2015-08-01 ENCOUNTER — Ambulatory Visit (INDEPENDENT_AMBULATORY_CARE_PROVIDER_SITE_OTHER): Payer: Medicare Other | Admitting: Cardiology

## 2015-08-01 ENCOUNTER — Encounter: Payer: Self-pay | Admitting: Cardiology

## 2015-08-01 VITALS — BP 130/80 | HR 53 | Ht 74.0 in | Wt 287.0 lb

## 2015-08-01 DIAGNOSIS — I350 Nonrheumatic aortic (valve) stenosis: Secondary | ICD-10-CM

## 2015-08-01 NOTE — Patient Instructions (Signed)
Your physician wants you to follow-up in: 1 Year. You will receive a reminder letter in the mail two months in advance. If you don't receive a letter, please call our office to schedule the follow-up appointment.  Your physician has requested that you have an echocardiogram. Echocardiography is a painless test that uses sound waves to create images of your heart. It provides your doctor with information about the size and shape of your heart and how well your heart's chambers and valves are working. This procedure takes approximately one hour. There are no restrictions for this procedure.  If you need a refill on your cardiac medications before your next appointment, please call your pharmacy.  Merry Christmas and Happy New Year!!
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
## 2015-08-08 ENCOUNTER — Other Ambulatory Visit: Payer: Self-pay | Admitting: Family Medicine

## 2015-08-08 ENCOUNTER — Ambulatory Visit (HOSPITAL_COMMUNITY): Payer: Medicare Other | Attending: Cardiology

## 2015-08-08 ENCOUNTER — Other Ambulatory Visit: Payer: Self-pay

## 2015-08-08 DIAGNOSIS — I517 Cardiomegaly: Secondary | ICD-10-CM | POA: Insufficient documentation

## 2015-08-08 DIAGNOSIS — I059 Rheumatic mitral valve disease, unspecified: Secondary | ICD-10-CM | POA: Diagnosis not present

## 2015-08-08 DIAGNOSIS — I77810 Thoracic aortic ectasia: Secondary | ICD-10-CM | POA: Insufficient documentation

## 2015-08-08 DIAGNOSIS — I350 Nonrheumatic aortic (valve) stenosis: Secondary | ICD-10-CM

## 2015-08-08 DIAGNOSIS — I071 Rheumatic tricuspid insufficiency: Secondary | ICD-10-CM | POA: Diagnosis not present

## 2015-08-09 ENCOUNTER — Ambulatory Visit
Admission: RE | Admit: 2015-08-09 | Discharge: 2015-08-09 | Disposition: A | Discharge status: Home / Self Care | Payer: Medicare Other | Source: Ambulatory Visit | Attending: Family Medicine | Admitting: Family Medicine

## 2015-08-09 DIAGNOSIS — R569 Unspecified convulsions: Secondary | ICD-10-CM | POA: Diagnosis not present

## 2015-08-09 DIAGNOSIS — IMO0001 Reserved for inherently not codable concepts without codable children: Secondary | ICD-10-CM

## 2015-08-09 MED ORDER — GADOBENATE DIMEGLUMINE 529 MG/ML IV SOLN
20.0000 mL | Freq: Once | INTRAVENOUS | Status: AC | PRN
  Administered 2015-08-09: 20 mL via INTRAVENOUS

## 2015-08-29 ENCOUNTER — Encounter: Payer: Self-pay | Admitting: Neurology

## 2015-08-29 ENCOUNTER — Ambulatory Visit (INDEPENDENT_AMBULATORY_CARE_PROVIDER_SITE_OTHER): Payer: Medicare Other | Admitting: Neurology

## 2015-08-29 VITALS — BP 124/80 | HR 76 | Ht 73.5 in | Wt 287.0 lb

## 2015-08-29 DIAGNOSIS — IMO0001 Reserved for inherently not codable concepts without codable children: Secondary | ICD-10-CM | POA: Insufficient documentation

## 2015-08-29 DIAGNOSIS — Z87820 Personal history of traumatic brain injury: Secondary | ICD-10-CM

## 2015-08-29 DIAGNOSIS — R251 Tremor, unspecified: Secondary | ICD-10-CM

## 2015-08-29 NOTE — Patient Instructions (Signed)
1. Schedule 1-hour sleep-deprived EEG 2. Hold off on riding bicycle for now 3. Follow-up in 1 month, call for any changes
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
## 2015-09-05 ENCOUNTER — Ambulatory Visit (INDEPENDENT_AMBULATORY_CARE_PROVIDER_SITE_OTHER): Payer: Medicare Other | Admitting: Neurology

## 2015-09-05 DIAGNOSIS — R251 Tremor, unspecified: Secondary | ICD-10-CM

## 2015-09-05 DIAGNOSIS — IMO0001 Reserved for inherently not codable concepts without codable children: Secondary | ICD-10-CM

## 2015-09-16 ENCOUNTER — Telehealth: Payer: Self-pay | Admitting: Neurology

## 2015-09-16 ENCOUNTER — Other Ambulatory Visit: Payer: Self-pay | Admitting: Family Medicine

## 2015-09-16 DIAGNOSIS — G40109 Localization-related (focal) (partial) symptomatic epilepsy and epileptic syndromes with simple partial seizures, not intractable, without status epilepticus: Secondary | ICD-10-CM

## 2015-09-16 MED ORDER — LEVETIRACETAM ER 500 MG PO TB24: ORAL_TABLET | ORAL | Status: DC

## 2015-09-16 NOTE — Procedures (Signed)
ELECTROENCEPHALOGRAM REPORT  Date of Study: 09/05/2015  Patient's Name: Patrick Pearson MRN: [id number] Date of Birth: April 11, 1966  Referring Provider: Dr. Ellouise Newer  Clinical History: 50 year old left-handed man with a history of hypertension, hyperlipidemia, significant TBI at age 45 with residual dysarthria, right arm ataxia, and mild left hemiparesis, presenting with an episode concerning for focal seizure arising from the left hemisphere. His MRI shows encephalomalacia in the left ACA distribution and right lateral temporal regions  Medications: Lisinopril, Crestor, Zyrtec  Technical Summary: A multichannel digital1-hour sleep-deprived EEG recording measured by the international 10-20 system with electrodes applied with paste and impedances below 5000 ohms performed in our laboratory with EKG monitoring in an awake and asleep patient.  Hyperventilation and photic stimulation were performed.  The digital EEG was referentially recorded, reformatted, and digitally filtered in a variety of bipolar and referential montages for optimal display.    Description: The patient is awake and asleep during the recording.  During maximal wakefulness, there is a symmetric, medium voltage 10 Hz posterior dominant rhythm that attenuates with eye opening.  There is occasional focal 4-5 Hz theta slowing seen over the left temporal region, at times sharply contoured. During drowsiness and sleep, there is an increase in theta slowing of the background.  Vertex waves and symmetric sleep spindles were seen.  Hyperventilation and photic stimulation did not elicit any abnormalities.  There were occasional left midtemporal epileptiform discharges seen. There were no electrographic seizures seen.    EKG lead was unremarkable.  Impression: This 1-hour awake and asleep EEG is abnormal due to the presence of: 1. Occasional focal slowing over the left temporal region 2. Occasional epileptiform discharges over the left  midtemporal region   Clinical Correlation of the above findings indicates focal cerebral dysfunction over the left temporal region suggestive of underlying structural or physiologic abnormality, with possible epileptogenic potential from this region.   Ellouise Newer, M.D.

## 2015-09-16 NOTE — Telephone Encounter (Signed)
Discussed EEG results with patient, showing a tendency for seizures to arise from the left temporal region. Recommendation is to start AED. He was given instructions to start Levetiracetam ER 500mg  1 tab qhs x 1 week, then increase to 2 tabs qhs. Side effects were discussed. He is anxious to return to riding his bike. Discussed holding off for now until I see him in office and see how he is doing. Rx sent to pharmacy. He has a f/u in a month.

## 2015-09-24 ENCOUNTER — Telehealth: Payer: Self-pay | Admitting: Neurology

## 2015-09-24 NOTE — Telephone Encounter (Signed)
Pt needs to talk to someone about her medication please call [phone]

## 2015-09-25 ENCOUNTER — Other Ambulatory Visit: Payer: Self-pay | Admitting: *Deleted

## 2015-09-25 DIAGNOSIS — G40109 Localization-related (focal) (partial) symptomatic epilepsy and epileptic syndromes with simple partial seizures, not intractable, without status epilepticus: Secondary | ICD-10-CM

## 2015-09-25 MED ORDER — LEVETIRACETAM ER 500 MG PO TB24: ORAL_TABLET | ORAL | Status: DC

## 2015-09-25 NOTE — Telephone Encounter (Signed)
Called patient back and he said that he would like his Rx sent to [facility] since he is changing to mail order pharmacy. Rx sent.

## 2015-10-10 ENCOUNTER — Other Ambulatory Visit: Payer: Self-pay | Admitting: Family Medicine

## 2015-10-14 ENCOUNTER — Ambulatory Visit (INDEPENDENT_AMBULATORY_CARE_PROVIDER_SITE_OTHER): Payer: Medicare Other | Admitting: Neurology

## 2015-10-14 ENCOUNTER — Encounter: Payer: Self-pay | Admitting: Neurology

## 2015-10-14 VITALS — BP 128/78 | HR 63 | Ht 73.5 in | Wt 292.0 lb

## 2015-10-14 DIAGNOSIS — Z87820 Personal history of traumatic brain injury: Secondary | ICD-10-CM | POA: Diagnosis not present

## 2015-10-14 DIAGNOSIS — G40109 Localization-related (focal) (partial) symptomatic epilepsy and epileptic syndromes with simple partial seizures, not intractable, without status epilepticus: Secondary | ICD-10-CM | POA: Diagnosis not present
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
## 2015-10-14 NOTE — Patient Instructions (Signed)
1. Continue Keppra XR 500mg : take 2 tablets daily 2. Would hold off on riding bicycle until 6 months seizure-free 3. Follow-up in 2 months, call for any changes

## 2015-11-18 ENCOUNTER — Other Ambulatory Visit: Payer: Self-pay | Admitting: Family Medicine

## 2015-11-18 ENCOUNTER — Telehealth: Payer: Self-pay | Admitting: Family Medicine

## 2015-11-18 NOTE — Telephone Encounter (Signed)
Pt request refill of the following: rosuvastatin (CRESTOR) 40 MG tablet   rx sent to [facility] need to go to [facility] [address]    Phamacy:   [facility] [address]

## 2015-11-18 NOTE — Telephone Encounter (Signed)
Pt request refill of the following: rosuvastatin (CRESTOR) 40 MG tablet   Phamacy:

## 2015-11-18 NOTE — Telephone Encounter (Signed)
Pt also needs crestor 40 mg #30 send to [facility].

## 2015-11-19 NOTE — Telephone Encounter (Signed)
Rx called in 

## 2015-11-21 ENCOUNTER — Other Ambulatory Visit (INDEPENDENT_AMBULATORY_CARE_PROVIDER_SITE_OTHER): Payer: Medicare Other

## 2015-11-21 DIAGNOSIS — Z0000 Encounter for general adult medical examination without abnormal findings: Secondary | ICD-10-CM | POA: Diagnosis not present

## 2015-11-21 LAB — BASIC METABOLIC PANEL
BUN: 21 mg/dL (ref 6–23)
CHLORIDE: 105 meq/L (ref 96–112)
CO2: 32 meq/L (ref 19–32)
Calcium: 9.2 mg/dL (ref 8.4–10.5)
Creatinine, Ser: 1.16 mg/dL (ref 0.40–1.50)
GFR: 71.17 mL/min (ref >60.00)
Glucose, Bld: 114 mg/dL — ABNORMAL HIGH (ref 70–99)
POTASSIUM: 4.3 meq/L (ref 3.5–5.1)
SODIUM: 143 meq/L (ref 135–145)

## 2015-11-21 LAB — LIPID PANEL
CHOL/HDL RATIO: 4
Cholesterol: 121 mg/dL (ref 0–200)
HDL: 28.9 mg/dL — AB (ref >39.00)
LDL CALC: 76 mg/dL (ref 0–99)
NONHDL: 91.8
Triglycerides: 78 mg/dL (ref 0.0–149.0)
VLDL: 15.6 mg/dL (ref 0.0–40.0)

## 2015-11-21 LAB — HEPATIC FUNCTION PANEL
ALK PHOS: 54 U/L (ref 39–117)
ALT: 35 U/L (ref 0–53)
AST: 24 U/L (ref 0–37)
Albumin: 4.1 g/dL (ref 3.5–5.2)
BILIRUBIN DIRECT: 0.1 mg/dL (ref 0.0–0.3)
BILIRUBIN TOTAL: 0.3 mg/dL (ref 0.2–1.2)
Total Protein: 6.4 g/dL (ref 6.0–8.3)

## 2015-11-21 LAB — POC URINALSYSI DIPSTICK (AUTOMATED)
BILIRUBIN UA: NEGATIVE
GLUCOSE UA: NEGATIVE
KETONES UA: NEGATIVE
Leukocytes, UA: NEGATIVE
Nitrite, UA: NEGATIVE
PH UA: 5.5
RBC UA: NEGATIVE
Spec Grav, UA: >=1.03
Urobilinogen, UA: 0.2

## 2015-11-21 LAB — CBC WITH DIFFERENTIAL/PLATELET
BASOS ABS: 0 10*3/uL (ref 0.0–0.1)
Basophils Relative: 0.6 % (ref 0.0–3.0)
EOS ABS: 0.1 10*3/uL (ref 0.0–0.7)
Eosinophils Relative: 2.2 % (ref 0.0–5.0)
HCT: 37.8 % — ABNORMAL LOW (ref 39.0–52.0)
Hemoglobin: 12.6 g/dL — ABNORMAL LOW (ref 13.0–17.0)
LYMPHS ABS: 2.3 10*3/uL (ref 0.7–4.0)
Lymphocytes Relative: 36.7 % (ref 12.0–46.0)
MCHC: 33.3 g/dL (ref 30.0–36.0)
MCV: 81 fl (ref 78.0–100.0)
MONO ABS: 0.5 10*3/uL (ref 0.1–1.0)
MONOS PCT: 8.8 % (ref 3.0–12.0)
NEUTROS PCT: 51.7 % (ref 43.0–77.0)
Neutro Abs: 3.2 10*3/uL (ref 1.4–7.7)
PLATELETS: 263 10*3/uL (ref 150.0–400.0)
RBC: 4.66 Mil/uL (ref 4.22–5.81)
RDW: 15.1 % (ref 11.5–15.5)
WBC: 6.2 10*3/uL (ref 4.0–10.5)

## 2015-11-21 LAB — TSH: TSH: 2.58 u[IU]/mL (ref 0.35–4.50)

## 2015-11-25 ENCOUNTER — Ambulatory Visit (INDEPENDENT_AMBULATORY_CARE_PROVIDER_SITE_OTHER): Payer: Medicare Other | Admitting: Family Medicine

## 2015-11-25 ENCOUNTER — Encounter: Payer: Self-pay | Admitting: Family Medicine

## 2015-11-25 VITALS — BP 123/74 | HR 63 | Temp 98.7°F | Ht 73.5 in | Wt 285.0 lb

## 2015-11-25 DIAGNOSIS — E785 Hyperlipidemia, unspecified: Secondary | ICD-10-CM

## 2015-11-25 DIAGNOSIS — G40109 Localization-related (focal) (partial) symptomatic epilepsy and epileptic syndromes with simple partial seizures, not intractable, without status epilepticus: Secondary | ICD-10-CM

## 2015-11-25 DIAGNOSIS — I350 Nonrheumatic aortic (valve) stenosis: Secondary | ICD-10-CM

## 2015-11-25 DIAGNOSIS — Z87820 Personal history of traumatic brain injury: Secondary | ICD-10-CM | POA: Diagnosis not present

## 2015-11-25 DIAGNOSIS — I10 Essential (primary) hypertension: Secondary | ICD-10-CM | POA: Diagnosis not present

## 2015-11-25 DIAGNOSIS — R7309 Other abnormal glucose: Secondary | ICD-10-CM

## 2015-11-25 MED ORDER — ROSUVASTATIN CALCIUM 40 MG PO TABS: ORAL_TABLET | ORAL | Status: DC

## 2015-11-25 MED ORDER — LISINOPRIL-HYDROCHLOROTHIAZIDE 20-25 MG PO TABS: 1.0000 | ORAL_TABLET | Freq: Every day | ORAL | Status: DC

## 2015-11-25 NOTE — Progress Notes (Signed)
Pre visit review using our clinic review tool, if applicable. No additional management support is needed unless otherwise documented below in the visit note. 

## 2015-11-25 NOTE — Progress Notes (Signed)
   Subjective:    Patient ID: Patrick Pearson, male    DOB: [birth year], 50 y.o.   MRN: [id number]  HPI 50 yr old male for a cpx. He is accompanied by his mother. He has been doing well lately although he seems to be having some side effects of the Keppra. He was started on this recently by Dr. Delice Lesch to control some seizure activity he had displayed, and it has worked quite well. He has not had any seizure like signs or symptoms since getting on this medication. However he often feels sluggish and he has a loss of motivation to do things. His mother has noticed a little short term memory loss as well.    Review of Systems  Constitutional: Negative.   HENT: Negative.   Eyes: Negative.   Respiratory: Negative.   Cardiovascular: Negative.   Gastrointestinal: Negative.   Genitourinary: Negative.   Musculoskeletal: Negative.   Skin: Negative.   Neurological: Negative.   Psychiatric/Behavioral: Negative.        Objective:   Physical Exam  Constitutional: He is oriented to person, place, and time. He appears well-developed and well-nourished. No distress.  HENT:  Head: Normocephalic and atraumatic.  Right Ear: External ear normal.  Left Ear: External ear normal.  Nose: Nose normal.  Mouth/Throat: Oropharynx is clear and moist. No oropharyngeal exudate.  Eyes: Conjunctivae and EOM are normal. Pupils are equal, round, and reactive to light. Right eye exhibits no discharge. Left eye exhibits no discharge. No scleral icterus.  Neck: Neck supple. No JVD present. No tracheal deviation present. No thyromegaly present.  Cardiovascular: Normal rate, regular rhythm and intact distal pulses.  Exam reveals no gallop and no friction rub.   Stable 2/6 SM   Pulmonary/Chest: Effort normal and breath sounds normal. No respiratory distress. He has no wheezes. He has no rales. He exhibits no tenderness.  Abdominal: Soft. Bowel sounds are normal. He exhibits no distension and no mass. There is no tenderness.  There is no rebound and no guarding.  Genitourinary: Rectum normal, prostate normal and penis normal. Guaiac negative stool. No penile tenderness.  Musculoskeletal: Normal range of motion. He exhibits no edema or tenderness.  Lymphadenopathy:    He has no cervical adenopathy.  Neurological: He is alert and oriented to person, place, and time.  Skin: Skin is warm and dry. No rash noted. He is not diaphoretic. No erythema. No pallor.  Psychiatric: He has a normal mood and affect. His behavior is normal. Judgment and thought content normal.          Assessment & Plan:  His HTN and hyperlipidemia are well controlled. He still has some prediabetes and we will monitor this closely. We discussed diet and exercise. He has been seizure free on Keppra but he seems to be having some side effects from it. I advised his mother to mention this to Dr. Delice Lesch.

## 2015-11-27 ENCOUNTER — Telehealth: Payer: Self-pay | Admitting: Neurology

## 2015-11-27 NOTE — Telephone Encounter (Signed)
Lmovm to rtn my call. 

## 2015-11-27 NOTE — Telephone Encounter (Signed)
Patient called asking to speak with you about his mouth swelling. His number is [id number]. He was returning your call. Thank you

## 2015-11-27 NOTE — Telephone Encounter (Signed)
Spoke with patient. He is going to come in tomorrow at 11:00.

## 2015-11-27 NOTE — Telephone Encounter (Signed)
I received a note from his mother regarding several concerns: 1. Memory lapses, she called him that she was picking him up in 30 minutes and he was not ready when she arrived. He asked "where are we going?"; Has forgetten where he puts things she had told him to keep; Dr. Barbie [facility] called about his lab appt, he did not relay message to his mother "I forgot."  2. Home maintenance is worse the previous. Trash rotting, cat pan not cleaned, food falls on floor and not picked up, clothes on floor (worse than before); has fruit going bad on his countertop 3. Volunteering at swim meets: unshaven, hair not combed. He also gets more agitate with swimmers walking in front of him 4. Starts and restarts whenever he is trying to tell you something 5. Tried to give her driving directions to avoid traffic lights, but could not figure out where they were. She tried to keep the peace and let him feel he has some sort of control over a small part of his life 6. His motivation level has changed and he at times doesn't care which shows in the points she had made   Tiff, can you pls call Mr. Mccutcheon and let him know I received records from Dr. Sarajane Jews about concerns for side effects from [place]. I can see him tomorrow or next week openings. Thanks!!

## 2015-11-28 ENCOUNTER — Ambulatory Visit (INDEPENDENT_AMBULATORY_CARE_PROVIDER_SITE_OTHER): Payer: Medicare Other | Admitting: Neurology

## 2015-11-28 ENCOUNTER — Encounter: Payer: Self-pay | Admitting: Neurology

## 2015-11-28 VITALS — BP 100/70 | HR 67 | Ht 73.5 in | Wt 289.0 lb

## 2015-11-28 DIAGNOSIS — G40109 Localization-related (focal) (partial) symptomatic epilepsy and epileptic syndromes with simple partial seizures, not intractable, without status epilepticus: Secondary | ICD-10-CM

## 2015-11-28 DIAGNOSIS — Z87820 Personal history of traumatic brain injury: Secondary | ICD-10-CM | POA: Diagnosis not present

## 2015-11-28 MED ORDER — LAMOTRIGINE ER 25 MG PO TB24: ORAL_TABLET | ORAL | Status: DC
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
## 2015-12-11 ENCOUNTER — Ambulatory Visit: Payer: Medicare Other | Admitting: Neurology

## 2015-12-26 ENCOUNTER — Telehealth: Payer: Self-pay | Admitting: Neurology

## 2015-12-26 DIAGNOSIS — G40109 Localization-related (focal) (partial) symptomatic epilepsy and epileptic syndromes with simple partial seizures, not intractable, without status epilepticus: Secondary | ICD-10-CM

## 2015-12-26 MED ORDER — LAMOTRIGINE ER 25 MG PO TB24: ORAL_TABLET | ORAL | Status: DC

## 2015-12-26 NOTE — Telephone Encounter (Signed)
Pt mother called and states that he will need a refill on the lamicaatl 25mg   He is taking 4 aday please call it in to the [facility] mail order. Pt mother Bubba Hales phone number is [phone]

## 2015-12-26 NOTE — Telephone Encounter (Signed)
Mother called back and said the patient does not need the refill now

## 2015-12-26 NOTE — Telephone Encounter (Signed)
New Rx sent to [facility].

## 2015-12-27 ENCOUNTER — Ambulatory Visit: Payer: Medicare Other | Admitting: Neurology

## 2016-01-14 ENCOUNTER — Telehealth: Payer: Self-pay | Admitting: Neurology

## 2016-01-14 NOTE — Telephone Encounter (Addendum)
Patrick Pearson 14-Sep-2065. He stopped in today with his mother to let us know that the medication (Lamotrigine) was too expensive for him. He was wondering if there was something else he could take. His number is [phone]. Thank you

## 2016-01-14 NOTE — Telephone Encounter (Signed)
Called patient and left msg on vm that I was aware of the cost of the medication. We are working on trying to get a tier exception through his ins so he can get the medication cheaper. If they don't approve the tier exception we will then look at medication alternative. Explained I will call him either way once I hear back from his ins co.

## 2016-01-17 MED ORDER — LAMOTRIGINE 25 MG PO TABS: ORAL_TABLET | ORAL | Status: DC

## 2016-01-17 MED ORDER — LAMOTRIGINE 25 MG PO TABS
ORAL_TABLET | ORAL | Status: DC
Start: 2016-01-17 — End: 2016-05-26

## 2016-01-17 NOTE — Telephone Encounter (Signed)
Patient will take Lamotrigine 25 mg tablets---2 tablets bid.

## 2016-01-17 NOTE — Telephone Encounter (Signed)
I spoke with patient and explained to him that his ins wouldn't approve tier exception. We are going to switch him to Lamotrigine IR 25 mg 2 tablets twice a day. Will call in 30 day Rx to his local pharmacy he is almost out of the current Rx he has. Will also send new Rx to his mail order pharmacy [facility].

## 2016-01-17 NOTE — Telephone Encounter (Signed)
OK to switch to lamotrigine IR at the same dose with same instructions.

## 2016-01-17 NOTE — Addendum Note (Signed)
Addended by: Thurmon Fair on: 01/17/2016 11:06 AM   Modules accepted: Orders, Medications

## 2016-01-17 NOTE — Telephone Encounter (Signed)
Tier exception was denied by his ins, he can't get the Lamotrigine ER(tier 4) 25 mg at lower cost. Lamotrigine IR is tier 2 and would be cheaper for patient. Please advise.

## 2016-03-10 ENCOUNTER — Ambulatory Visit: Payer: Medicare Other | Admitting: Adult Health

## 2016-03-13 ENCOUNTER — Ambulatory Visit (INDEPENDENT_AMBULATORY_CARE_PROVIDER_SITE_OTHER): Payer: Medicare Other | Admitting: Adult Health

## 2016-03-13 ENCOUNTER — Encounter: Payer: Self-pay | Admitting: Adult Health

## 2016-03-13 VITALS — BP 126/86 | HR 74 | Temp 98.0°F | Ht 74.0 in | Wt 286.0 lb

## 2016-03-13 DIAGNOSIS — G4733 Obstructive sleep apnea (adult) (pediatric): Secondary | ICD-10-CM | POA: Diagnosis not present

## 2016-03-13 NOTE — Patient Instructions (Signed)
Keep up great job.  Continue on CPAP At bedtime   Order sent for supplies and mask.  Follow up with Dr. Alva  In 1 year and As needed    

## 2016-03-13 NOTE — Assessment & Plan Note (Signed)
Compensated on CPAP   Plan  Keep up great job.  Continue on CPAP At bedtime   Order sent for supplies and mask.  Follow up with Dr. Elsworth Soho  In 1 year and As needed
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
## 2016-03-17 ENCOUNTER — Encounter: Payer: Self-pay | Admitting: Neurology

## 2016-03-17 ENCOUNTER — Ambulatory Visit (INDEPENDENT_AMBULATORY_CARE_PROVIDER_SITE_OTHER): Payer: Medicare Other | Admitting: Neurology

## 2016-03-17 VITALS — BP 116/70 | HR 76 | Temp 98.5°F | Ht 74.0 in | Wt 285.0 lb

## 2016-03-17 DIAGNOSIS — Z87820 Personal history of traumatic brain injury: Secondary | ICD-10-CM

## 2016-03-17 DIAGNOSIS — G40109 Localization-related (focal) (partial) symptomatic epilepsy and epileptic syndromes with simple partial seizures, not intractable, without status epilepticus: Secondary | ICD-10-CM | POA: Diagnosis not present
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
## 2016-03-23 ENCOUNTER — Encounter: Payer: Self-pay | Admitting: Adult Health

## 2016-05-25 ENCOUNTER — Other Ambulatory Visit: Payer: Self-pay | Admitting: Neurology

## 2016-05-26 ENCOUNTER — Other Ambulatory Visit: Payer: Self-pay | Admitting: *Deleted

## 2016-05-26 MED ORDER — LAMOTRIGINE 25 MG PO TABS: ORAL_TABLET | ORAL | 1 refills | Status: DC

## 2016-05-26 NOTE — Telephone Encounter (Signed)
Rx sent 

## 2016-06-16 DIAGNOSIS — Z23 Encounter for immunization: Secondary | ICD-10-CM | POA: Diagnosis not present

## 2016-06-22 ENCOUNTER — Ambulatory Visit (INDEPENDENT_AMBULATORY_CARE_PROVIDER_SITE_OTHER): Payer: Medicare Other | Admitting: Neurology

## 2016-06-22 ENCOUNTER — Encounter: Payer: Self-pay | Admitting: Neurology

## 2016-06-22 VITALS — BP 122/68 | HR 69 | Ht 74.0 in | Wt 288.3 lb

## 2016-06-22 DIAGNOSIS — Z87820 Personal history of traumatic brain injury: Secondary | ICD-10-CM | POA: Diagnosis not present

## 2016-06-22 DIAGNOSIS — G40109 Localization-related (focal) (partial) symptomatic epilepsy and epileptic syndromes with simple partial seizures, not intractable, without status epilepticus: Secondary | ICD-10-CM

## 2016-06-22 MED ORDER — ZONISAMIDE 100 MG PO CAPS: ORAL_CAPSULE | ORAL | 2 refills | Status: DC
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
## 2016-06-24 ENCOUNTER — Encounter: Payer: Self-pay | Admitting: Neurology

## 2016-06-26 ENCOUNTER — Telehealth: Payer: Self-pay | Admitting: Neurology

## 2016-06-26 NOTE — Telephone Encounter (Signed)
Patient mother [employer] states they would like a handicap sticker for the car. They are going out of town today and would like to pick it up today please call [phone]

## 2016-06-26 NOTE — Telephone Encounter (Signed)
Notified patient's mother Carlene she would need to get form from the [facility] and we would be happy to complete form. She states they are leaving today and will not have time to pick up form from [facility] today but will bring form to [place] in advance next time.

## 2016-07-16 DIAGNOSIS — D235 Other benign neoplasm of skin of trunk: Secondary | ICD-10-CM | POA: Diagnosis not present

## 2016-07-16 DIAGNOSIS — D485 Neoplasm of uncertain behavior of skin: Secondary | ICD-10-CM | POA: Diagnosis not present

## 2016-07-16 DIAGNOSIS — D1801 Hemangioma of skin and subcutaneous tissue: Secondary | ICD-10-CM | POA: Diagnosis not present

## 2016-07-16 DIAGNOSIS — D225 Melanocytic nevi of trunk: Secondary | ICD-10-CM | POA: Diagnosis not present

## 2016-07-16 DIAGNOSIS — L219 Seborrheic dermatitis, unspecified: Secondary | ICD-10-CM | POA: Diagnosis not present

## 2016-08-05 ENCOUNTER — Ambulatory Visit: Payer: Medicare Other | Admitting: Neurology

## 2016-08-06 ENCOUNTER — Encounter: Payer: Self-pay | Admitting: Cardiology
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
## 2016-08-07 ENCOUNTER — Encounter: Payer: Self-pay | Admitting: Cardiology

## 2016-08-07 ENCOUNTER — Ambulatory Visit (INDEPENDENT_AMBULATORY_CARE_PROVIDER_SITE_OTHER): Payer: Medicare Other | Admitting: Cardiology

## 2016-08-07 VITALS — BP 128/78 | HR 60 | Ht 74.0 in | Wt 280.0 lb

## 2016-08-07 DIAGNOSIS — I350 Nonrheumatic aortic (valve) stenosis: Secondary | ICD-10-CM

## 2016-08-07 NOTE — Patient Instructions (Addendum)
Medication Instructions:  Continue current medications  Labwork: None Ordered  Testing/Procedures: Your physician has requested that you have an echocardiogram in 1 Year. Echocardiography is a painless test that uses sound waves to create images of your heart. It provides your doctor with information about the size and shape of your heart and how well your heart's chambers and valves are working. This procedure takes approximately one hour. There are no restrictions for this procedure.   Follow-Up: Your physician wants you to follow-up in: 1 Year. You will receive a reminder letter in the mail two months in advance. If you don't receive a letter, please call our office to schedule the follow-up appointment.   Any Other Special Instructions Will Be Listed Below (If Applicable).     If you need a refill on your cardiac medications before your next appointment, please call your pharmacy.   

## 2016-09-04 ENCOUNTER — Encounter: Payer: Self-pay | Admitting: Adult Health

## 2016-09-04 ENCOUNTER — Ambulatory Visit (INDEPENDENT_AMBULATORY_CARE_PROVIDER_SITE_OTHER): Payer: Medicare Other | Admitting: Adult Health

## 2016-09-04 VITALS — BP 124/70 | Temp 98.2°F | Ht 74.0 in | Wt 275.8 lb

## 2016-09-04 DIAGNOSIS — J209 Acute bronchitis, unspecified: Secondary | ICD-10-CM

## 2016-09-04 MED ORDER — PREDNISONE 10 MG PO TABS: ORAL_TABLET | ORAL | 0 refills | Status: DC

## 2016-09-04 MED ORDER — AZITHROMYCIN 250 MG PO TABS: ORAL_TABLET | ORAL | 0 refills | Status: DC

## 2016-09-04 MED ORDER — HYDROCODONE-HOMATROPINE 5-1.5 MG/5ML PO SYRP: 5.0000 mL | ORAL_SOLUTION | Freq: Three times a day (TID) | ORAL | 0 refills | Status: DC | PRN
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
## 2016-09-09 ENCOUNTER — Encounter: Payer: Self-pay | Admitting: Neurology

## 2016-09-09 ENCOUNTER — Ambulatory Visit (INDEPENDENT_AMBULATORY_CARE_PROVIDER_SITE_OTHER): Payer: Medicare Other | Admitting: Neurology

## 2016-09-09 VITALS — BP 122/68 | HR 75 | Ht 74.0 in | Wt 281.6 lb

## 2016-09-09 DIAGNOSIS — G40109 Localization-related (focal) (partial) symptomatic epilepsy and epileptic syndromes with simple partial seizures, not intractable, without status epilepticus: Secondary | ICD-10-CM | POA: Diagnosis not present

## 2016-09-09 DIAGNOSIS — Z87820 Personal history of traumatic brain injury: Secondary | ICD-10-CM
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
## 2016-09-09 NOTE — Patient Instructions (Signed)
1. Continue Zonisamide 100mg : Take 3 capsules at night 2. If balance problems worsen, call our office and we will plan for Balance Therapy 3. Follow-up in 4 months, call for any changes  Seizure Precautions: 1. If medication has been prescribed for you to prevent seizures, take it exactly as directed.  Do not stop taking the medicine without talking to your doctor first, even if you have not had a seizure in a long time.   2. Avoid activities in which a seizure would cause danger to yourself or to others.  Don't operate dangerous machinery, swim alone, or climb in high or dangerous places, such as on ladders, roofs, or girders.  Do not drive unless your doctor says you may.  3. If you have any warning that you may have a seizure, lay down in a safe place where you can't hurt yourself.    4.  No driving for 6 months from last seizure, as per [facility].   Please refer to the following link on the [place] website for more information: http://www.epilepsyfoundation.org/answerplace/Social/driving/drivingu.cfm   5.  Maintain good sleep hygiene. Avoid alcohol.  6.  Contact your doctor if you have any problems that may be related to the medicine you are taking.  7.  Call 911 and bring the patient back to the ED if:        A.  The seizure lasts longer than 5 minutes.       B.  The patient doesn't awaken shortly after the seizure  C.  The patient has new problems such as difficulty seeing, speaking or moving  D.  The patient was injured during the seizure  E.  The patient has a temperature over 102 F (39C)  F.  The patient vomited and now is having trouble breathing

## 2016-09-28 ENCOUNTER — Other Ambulatory Visit: Payer: Self-pay | Admitting: Family Medicine

## 2016-10-05 ENCOUNTER — Other Ambulatory Visit: Payer: Self-pay

## 2016-10-05 ENCOUNTER — Telehealth: Payer: Self-pay | Admitting: Neurology

## 2016-10-05 MED ORDER — ZONISAMIDE 100 MG PO CAPS: ORAL_CAPSULE | ORAL | 2 refills | Status: DC

## 2016-10-05 NOTE — Telephone Encounter (Signed)
Patient wants to get more refills on the zonegran 100mg  patient phone number is [phone]

## 2016-10-05 NOTE — Telephone Encounter (Signed)
Verified with patient RX needed to be sent to [place]. RX sent, per last OV note patient to continue.

## 2016-11-19 ENCOUNTER — Other Ambulatory Visit (HOSPITAL_COMMUNITY): Payer: Medicare Other

## 2016-12-08 ENCOUNTER — Other Ambulatory Visit: Payer: Self-pay | Admitting: Family Medicine

## 2016-12-14 ENCOUNTER — Telehealth: Payer: Self-pay | Admitting: Neurology

## 2016-12-14 MED ORDER — ZONISAMIDE 100 MG PO CAPS: ORAL_CAPSULE | ORAL | 2 refills | Status: DC

## 2016-12-14 NOTE — Telephone Encounter (Signed)
Patient needed a refill on zonegran. RX sent to [place].

## 2016-12-14 NOTE — Telephone Encounter (Signed)
PT called and I am having a really hard time understanding him, I think it has to do with a prescription but not sure/Patrick Pearson

## 2017-01-18 ENCOUNTER — Other Ambulatory Visit: Payer: Self-pay | Admitting: Family Medicine

## 2017-01-19 ENCOUNTER — Ambulatory Visit (INDEPENDENT_AMBULATORY_CARE_PROVIDER_SITE_OTHER): Payer: Medicare Other | Admitting: Neurology

## 2017-01-19 ENCOUNTER — Encounter: Payer: Self-pay | Admitting: Neurology

## 2017-01-19 VITALS — BP 128/62 | HR 88 | Ht 74.0 in | Wt 278.0 lb

## 2017-01-19 DIAGNOSIS — G40109 Localization-related (focal) (partial) symptomatic epilepsy and epileptic syndromes with simple partial seizures, not intractable, without status epilepticus: Secondary | ICD-10-CM | POA: Diagnosis not present

## 2017-01-19 DIAGNOSIS — Z87820 Personal history of traumatic brain injury: Secondary | ICD-10-CM | POA: Diagnosis not present

## 2017-01-19 DIAGNOSIS — R3911 Hesitancy of micturition: Secondary | ICD-10-CM | POA: Diagnosis not present

## 2017-01-19 MED ORDER — ZONISAMIDE 100 MG PO CAPS: ORAL_CAPSULE | ORAL | 3 refills | Status: DC

## 2017-01-19 NOTE — Telephone Encounter (Signed)
Patient is due for a Physical before next refill

## 2017-01-19 NOTE — Patient Instructions (Signed)
1. Continue Zonisamide 300mg  at bedtime 2. Continue to monitor falls and ataxia 3. Refer to urology for urinary hesitancy 4. Follow-up in 6 months, call for any changes  Seizure Precautions: 1. If medication has been prescribed for you to prevent seizures, take it exactly as directed.  Do not stop taking the medicine without talking to your doctor first, even if you have not had a seizure in a long time.   2. Avoid activities in which a seizure would cause danger to yourself or to others.  Don't operate dangerous machinery, swim alone, or climb in high or dangerous places, such as on ladders, roofs, or girders.  Do not drive unless your doctor says you may.  3. If you have any warning that you may have a seizure, lay down in a safe place where you can't hurt yourself.    4.  No driving for 6 months from last seizure, as per [facility].   Please refer to the following link on the [place] website for more information: http://www.epilepsyfoundation.org/answerplace/Social/driving/drivingu.cfm   5.  Maintain good sleep hygiene. Avoid alcohol.  6.  Contact your doctor if you have any problems that may be related to the medicine you are taking.  7.  Call 911 and bring the patient back to the ED if:        A.  The seizure lasts longer than 5 minutes.       B.  The patient doesn't awaken shortly after the seizure  C.  The patient has new problems such as difficulty seeing, speaking or moving  D.  The patient was injured during the seizure  E.  The patient has a temperature over 102 F (39C)  F.  The patient vomited and now is having trouble breathing
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
## 2017-02-11 ENCOUNTER — Other Ambulatory Visit: Payer: Self-pay | Admitting: Family Medicine

## 2017-02-25 ENCOUNTER — Telehealth: Payer: Self-pay | Admitting: Adult Health

## 2017-02-25 NOTE — Telephone Encounter (Signed)
Called spoke with patient's mother Carlene (DPR on file) who reported that she already got what she needed from medical records because she had to go there for another office's notes and when ahead and got pulmonary's last ov note while she was there.  She verified that nothing further is needed at this time  Will sign off

## 2017-05-10 ENCOUNTER — Other Ambulatory Visit: Payer: Self-pay | Admitting: Family Medicine

## 2017-05-12 ENCOUNTER — Telehealth: Payer: Self-pay

## 2017-05-12 NOTE — Telephone Encounter (Signed)
Pt has been scheduled for 05/27/17

## 2017-05-12 NOTE — Telephone Encounter (Signed)
I sent in a 3 month supply of his blood pressure medicine, but he hasn't been seen by Dr. Sarajane Jews since 10/2015.   Can we get him on the schedule please?  Thank you!

## 2017-05-27 ENCOUNTER — Encounter: Payer: Self-pay | Admitting: Internal Medicine

## 2017-05-27 ENCOUNTER — Encounter: Payer: Self-pay | Admitting: Family Medicine

## 2017-05-27 ENCOUNTER — Ambulatory Visit (INDEPENDENT_AMBULATORY_CARE_PROVIDER_SITE_OTHER): Payer: Medicare Other | Admitting: Family Medicine

## 2017-05-27 VITALS — BP 119/77 | HR 61 | Temp 98.4°F | Ht 74.0 in | Wt 280.0 lb

## 2017-05-27 DIAGNOSIS — N138 Other obstructive and reflux uropathy: Secondary | ICD-10-CM

## 2017-05-27 DIAGNOSIS — G43101 Migraine with aura, not intractable, with status migrainosus: Secondary | ICD-10-CM | POA: Diagnosis not present

## 2017-05-27 DIAGNOSIS — I10 Essential (primary) hypertension: Secondary | ICD-10-CM | POA: Diagnosis not present

## 2017-05-27 DIAGNOSIS — S069X9D Unspecified intracranial injury with loss of consciousness of unspecified duration, subsequent encounter: Secondary | ICD-10-CM | POA: Diagnosis not present

## 2017-05-27 DIAGNOSIS — N401 Enlarged prostate with lower urinary tract symptoms: Secondary | ICD-10-CM

## 2017-05-27 DIAGNOSIS — E785 Hyperlipidemia, unspecified: Secondary | ICD-10-CM | POA: Insufficient documentation

## 2017-05-27 DIAGNOSIS — Z23 Encounter for immunization: Secondary | ICD-10-CM

## 2017-05-27 DIAGNOSIS — Z0000 Encounter for general adult medical examination without abnormal findings: Secondary | ICD-10-CM | POA: Diagnosis not present

## 2017-05-27 DIAGNOSIS — R7309 Other abnormal glucose: Secondary | ICD-10-CM

## 2017-05-27 LAB — LIPID PANEL
CHOL/HDL RATIO: 3
Cholesterol: 113 mg/dL (ref 0–200)
HDL: 32.6 mg/dL — AB (ref >39.00)
LDL Cholesterol: 63 mg/dL (ref 0–99)
NonHDL: 80.69
TRIGLYCERIDES: 90 mg/dL (ref 0.0–149.0)
VLDL: 18 mg/dL (ref 0.0–40.0)

## 2017-05-27 LAB — CBC WITH DIFFERENTIAL/PLATELET
BASOS ABS: 0 10*3/uL (ref 0.0–0.1)
Basophils Relative: 0.4 % (ref 0.0–3.0)
EOS ABS: 0.1 10*3/uL (ref 0.0–0.7)
Eosinophils Relative: 1.4 % (ref 0.0–5.0)
HCT: 44.5 % (ref 39.0–52.0)
Hemoglobin: 14.2 g/dL (ref 13.0–17.0)
LYMPHS ABS: 2 10*3/uL (ref 0.7–4.0)
Lymphocytes Relative: 34.9 % (ref 12.0–46.0)
MCHC: 31.9 g/dL (ref 30.0–36.0)
MCV: 86.2 fl (ref 78.0–100.0)
MONO ABS: 0.6 10*3/uL (ref 0.1–1.0)
Monocytes Relative: 10.5 % (ref 3.0–12.0)
NEUTROS ABS: 3 10*3/uL (ref 1.4–7.7)
Neutrophils Relative %: 52.8 % (ref 43.0–77.0)
PLATELETS: 252 10*3/uL (ref 150.0–400.0)
RBC: 5.16 Mil/uL (ref 4.22–5.81)
RDW: 14.5 % (ref 11.5–15.5)
WBC: 5.8 10*3/uL (ref 4.0–10.5)

## 2017-05-27 LAB — HEPATIC FUNCTION PANEL
ALBUMIN: 4.4 g/dL (ref 3.5–5.2)
ALT: 31 U/L (ref 0–53)
AST: 23 U/L (ref 0–37)
Alkaline Phosphatase: 60 U/L (ref 39–117)
Bilirubin, Direct: 0.1 mg/dL (ref 0.0–0.3)
Total Bilirubin: 0.4 mg/dL (ref 0.2–1.2)
Total Protein: 6.3 g/dL (ref 6.0–8.3)

## 2017-05-27 LAB — POC URINALSYSI DIPSTICK (AUTOMATED)
Glucose, UA: NEGATIVE
Leukocytes, UA: NEGATIVE
Nitrite, UA: NEGATIVE
PH UA: 6 (ref 5.0–8.0)
RBC UA: NEGATIVE
Spec Grav, UA: >=1.03 — AB (ref 1.010–1.025)
Urobilinogen, UA: 1 E.U./dL

## 2017-05-27 LAB — BASIC METABOLIC PANEL
BUN: 15 mg/dL (ref 6–23)
CALCIUM: 9.6 mg/dL (ref 8.4–10.5)
CHLORIDE: 105 meq/L (ref 96–112)
CO2: 30 mEq/L (ref 19–32)
CREATININE: 1.46 mg/dL (ref 0.40–1.50)
GFR: 54.24 mL/min — ABNORMAL LOW (ref >60.00)
Glucose, Bld: 117 mg/dL — ABNORMAL HIGH (ref 70–99)
Potassium: 4.7 mEq/L (ref 3.5–5.1)
Sodium: 139 mEq/L (ref 135–145)

## 2017-05-27 LAB — TSH: TSH: 2.41 u[IU]/mL (ref 0.35–4.50)

## 2017-05-27 LAB — PSA: PSA: 1.32 ng/mL (ref 0.10–4.00)

## 2017-05-27 LAB — HEMOGLOBIN A1C: Hgb A1c MFr Bld: 6.5 % (ref 4.6–6.5)

## 2017-05-27 MED ORDER — LISINOPRIL-HYDROCHLOROTHIAZIDE 20-25 MG PO TABS: ORAL_TABLET | ORAL | 3 refills | Status: DC

## 2017-05-27 MED ORDER — ROSUVASTATIN CALCIUM 40 MG PO TABS: 40.0000 mg | ORAL_TABLET | Freq: Every day | ORAL | 3 refills | Status: DC

## 2017-05-27 MED ORDER — SILDENAFIL CITRATE 100 MG PO TABS: 100.0000 mg | ORAL_TABLET | ORAL | 3 refills | Status: DC | PRN

## 2017-05-27 NOTE — Patient Instructions (Signed)
WE NOW OFFER   Patrick Pearson's FAST TRACK!!!  SAME DAY Appointments for ACUTE CARE  Such as: Sprains, Injuries, cuts, abrasions, rashes, muscle pain, joint pain, back pain Colds, flu, sore throats, headache, allergies, cough, fever  Ear pain, sinus and eye infections Abdominal pain, nausea, vomiting, diarrhea, upset stomach Animal/insect bites  3 Easy Ways to Schedule: Walk-In Scheduling Call in scheduling Mychart Sign-up: [email or web address]         

## 2017-05-27 NOTE — Progress Notes (Signed)
   Subjective:    Patient ID: Patrick Pearson, male    DOB: 1966-08-28, 51 y.o.   MRN: [id number]  HPI Here to follow up on issues. He feels well and he has shown no seizure activity for at least 6 months. His BP is stable. He does not exercise much but he watches his diet.    Review of Systems  HENT: Negative.   Eyes: Negative.   Respiratory: Negative.   Cardiovascular: Negative.   Gastrointestinal: Negative.   Genitourinary: Negative.   Musculoskeletal: Negative.   Skin: Negative.   Neurological: Positive for speech difficulty and weakness. Negative for dizziness, tremors, seizures, syncope, facial asymmetry, light-headedness, numbness and headaches.  Psychiatric/Behavioral: Negative.        Objective:   Physical Exam  Constitutional: He is oriented to person, place, and time. He appears well-developed and well-nourished. No distress.  HENT:  Head: Normocephalic and atraumatic.  Right Ear: External ear normal.  Left Ear: External ear normal.  Nose: Nose normal.  Mouth/Throat: Oropharynx is clear and moist. No oropharyngeal exudate.  Eyes: Pupils are equal, round, and reactive to light. Conjunctivae and EOM are normal. Right eye exhibits no discharge. Left eye exhibits no discharge. No scleral icterus.  Neck: Neck supple. No JVD present. No tracheal deviation present. No thyromegaly present.  Cardiovascular: Normal rate, regular rhythm, normal heart sounds and intact distal pulses.  Exam reveals no gallop and no friction rub.   No murmur heard. Pulmonary/Chest: Effort normal and breath sounds normal. No respiratory distress. He has no wheezes. He has no rales. He exhibits no tenderness.  Abdominal: Soft. Bowel sounds are normal. He exhibits no distension and no mass. There is no tenderness. There is no rebound and no guarding.  Genitourinary: Rectum normal, prostate normal and penis normal. Rectal exam shows guaiac negative stool. No penile tenderness.  Musculoskeletal: Normal  range of motion. He exhibits no edema or tenderness.  Lymphadenopathy:    He has no cervical adenopathy.  Neurological: He is alert and oriented to person, place, and time. He has normal reflexes. He exhibits normal muscle tone.  He is at his baseline   Skin: Skin is warm and dry. No rash noted. He is not diaphoretic. No erythema. No pallor.  Psychiatric: He has a normal mood and affect. His behavior is normal. Judgment and thought content normal.          Assessment & Plan:  His seizure disorder S/P traumatic brain injury is stable. His HTN is controlled. We will get fasting labs today to follow his glucose and lipids. Set up his first colonoscopy.  Alysia Penna, MD

## 2017-06-01 ENCOUNTER — Telehealth: Payer: Self-pay | Admitting: Family Medicine

## 2017-06-01 NOTE — Telephone Encounter (Signed)
Per pt call he would like the results of his labs . Patient stated he is returning a call please advise .

## 2017-06-02 NOTE — Telephone Encounter (Signed)
Pt is calling back

## 2017-06-03 NOTE — Telephone Encounter (Signed)
I spoke with pt and went over results. 

## 2017-06-04 ENCOUNTER — Telehealth: Payer: Self-pay | Admitting: Family Medicine

## 2017-06-04 DIAGNOSIS — I10 Essential (primary) hypertension: Secondary | ICD-10-CM

## 2017-06-04 DIAGNOSIS — R7309 Other abnormal glucose: Secondary | ICD-10-CM

## 2017-06-04 DIAGNOSIS — E785 Hyperlipidemia, unspecified: Secondary | ICD-10-CM

## 2017-06-04 NOTE — Telephone Encounter (Signed)
Macai Sisneros Self [phone]  Daden dropped by to pick up labs and he also wanted Dr Sarajane Jews to put in a referral for Nutrition

## 2017-06-07 NOTE — Telephone Encounter (Signed)
The referral was done  

## 2017-06-08 NOTE — Telephone Encounter (Signed)
I left a message with below information.  

## 2017-07-14 ENCOUNTER — Ambulatory Visit (AMBULATORY_SURGERY_CENTER): Payer: Self-pay | Admitting: *Deleted

## 2017-07-14 ENCOUNTER — Other Ambulatory Visit: Payer: Self-pay

## 2017-07-14 VITALS — Ht 73.0 in | Wt 281.4 lb

## 2017-07-14 DIAGNOSIS — Z1211 Encounter for screening for malignant neoplasm of colon: Secondary | ICD-10-CM

## 2017-07-14 NOTE — Progress Notes (Signed)
Patient denies any allergies to egg or soy products. Patient denies complications with anesthesia/sedation.  Patient denies oxygen use at home and denies diet medications. Pamphlet given on colonoscopy. 

## 2017-07-19 ENCOUNTER — Encounter: Payer: Self-pay | Admitting: Neurology

## 2017-07-19 ENCOUNTER — Ambulatory Visit (INDEPENDENT_AMBULATORY_CARE_PROVIDER_SITE_OTHER): Payer: Medicare Other | Admitting: Neurology

## 2017-07-19 VITALS — BP 96/58 | HR 82 | Ht 73.0 in | Wt 278.0 lb

## 2017-07-19 DIAGNOSIS — Z87820 Personal history of traumatic brain injury: Secondary | ICD-10-CM

## 2017-07-19 DIAGNOSIS — G40109 Localization-related (focal) (partial) symptomatic epilepsy and epileptic syndromes with simple partial seizures, not intractable, without status epilepticus: Secondary | ICD-10-CM | POA: Diagnosis not present

## 2017-07-19 MED ORDER — ZONISAMIDE 100 MG PO CAPS: ORAL_CAPSULE | ORAL | 3 refills | Status: DC

## 2017-07-19 NOTE — Patient Instructions (Addendum)
1. Reduce Zonisamide to 200mg  at bedtime 2. Keep an eye on the symptoms with the reduction in medication, if any seizure, go back to 300mg  at night 3. Follow-up in 6 months, call for any changes  Seizure Precautions:  1. If medication has been prescribed for you to prevent seizures, take it exactly as directed.  Do not stop taking the medicine without talking to your doctor first, even if you have not had a seizure in a long time.   2. Avoid activities in which a seizure would cause danger to yourself or to others.  Don't operate dangerous machinery, swim alone, or climb in high or dangerous places, such as on ladders, roofs, or girders.  Do not drive unless your doctor says you may.  3. If you have any warning that you may have a seizure, lay down in a safe place where you can't hurt yourself.    4.  No driving for 6 months from last seizure, as per [facility].   Please refer to the following link on the [place] website for more information: http://www.epilepsyfoundation.org/answerplace/Social/driving/drivingu.cfm   5.  Maintain good sleep hygiene.  6.  Contact your doctor if you have any problems that may be related to the medicine you are taking.  7.  Call 911 and bring the patient back to the ED if:        A.  The seizure lasts longer than 5 minutes.       B.  The patient doesn't awaken shortly after the seizure  C.  The patient has new problems such as difficulty seeing, speaking or moving  D.  The patient was injured during the seizure  E.  The patient has a temperature over 102 F (39C)  F.  The patient vomited and now is having trouble breathing
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
## 2017-07-28 ENCOUNTER — Encounter: Payer: Self-pay | Admitting: Internal Medicine

## 2017-07-28 ENCOUNTER — Ambulatory Visit (AMBULATORY_SURGERY_CENTER): Payer: Medicare Other | Admitting: Internal Medicine

## 2017-07-28 VITALS — BP 99/65 | HR 55 | Temp 97.1°F | Resp 21 | Ht 73.0 in | Wt 281.0 lb

## 2017-07-28 DIAGNOSIS — Z1212 Encounter for screening for malignant neoplasm of rectum: Secondary | ICD-10-CM

## 2017-07-28 DIAGNOSIS — Z1211 Encounter for screening for malignant neoplasm of colon: Secondary | ICD-10-CM

## 2017-07-28 HISTORY — PX: COLONOSCOPY: SHX174

## 2017-07-28 MED ORDER — SODIUM CHLORIDE 0.9 % IV SOLN: 500.0000 mL | INTRAVENOUS | Status: DC

## 2017-07-28 NOTE — Op Note (Addendum)
[place] Patient Name: Patrick Pearson Procedure Date: 07/28/2017 10:27 AM MRN: [id number] Endoscopist: Gatha Mayer , MD Age: 51 Referring MD:  Date of Birth: April 14, 1966 Gender: Male Account #: [id number] Procedure:                Colonoscopy Indications:              Screening for colorectal malignant neoplasm, This                            is the patient's first colonoscopy Medicines:                Propofol per Anesthesia, Monitored Anesthesia Care Procedure:                Pre-Anesthesia Assessment:                           - Prior to the procedure, a History and Physical                            was performed, and patient medications and                            allergies were reviewed. The patient's tolerance of                            previous anesthesia was also reviewed. The risks                            and benefits of the procedure and the sedation                            options and risks were discussed with the patient.                            All questions were answered, and informed consent                            was obtained. Prior Anticoagulants: The patient has                            taken no previous anticoagulant or antiplatelet                            agents. ASA Grade Assessment: II - A patient with                            mild systemic disease. After reviewing the risks                            and benefits, the patient was deemed in                            satisfactory condition to undergo the procedure.  After obtaining informed consent, the colonoscope                            was passed under direct vision. Throughout the                            procedure, the patient's blood pressure, pulse, and                            oxygen saturations were monitored continuously. The                            Colonoscope was introduced through the anus and   advanced to the the cecum, identified by                            appendiceal orifice and ileocecal valve. The                            ileocecal valve, appendiceal orifice, and rectum                            were photographed. The quality of the bowel                            preparation was good. The bowel preparation used                            was Miralax. Scope In: 10:49:25 AM Scope Out: 11:04:22 AM Scope Withdrawal Time: 0 hours 10 minutes 17 seconds  Total Procedure Duration: 0 hours 14 minutes 57 seconds  Findings:                 The perianal and digital rectal examinations were                            normal. Pertinent negatives include normal prostate                            (size, shape, and consistency).                           Multiple small and large-mouthed diverticula were                            found in the entire colon.                           The exam was otherwise without abnormality on                            direct and retroflexion views. Complications:            No immediate complications. Estimated blood loss:  None. Estimated Blood Loss:     Estimated blood loss: none. Recommendation:           - Repeat colonoscopy in 10 years for screening                            purposes.                           - Resume previous diet.                           - Continue present medications. Gatha Mayer, MD 07/28/2017 11:09:57 AM This report has been signed electronically.

## 2017-07-28 NOTE — Progress Notes (Signed)
Pt's states no medical or surgical changes since previsit or office visit. 

## 2017-07-28 NOTE — Patient Instructions (Addendum)
   No polyps or cancer seen.  You do have diverticulosis - thickened muscle rings and pouches in the colon wall. Please read the handout about this condition.  Next routine colonoscopy or other screening test in 10 years - 2028  I appreciate the opportunity to care for you. Gatha Mayer, MD, FACG   YOU HAD AN ENDOSCOPIC PROCEDURE TODAY: Refer to the procedure report and other information in the discharge instructions given to you for any specific questions about what was found during the examination. If this information does not answer your questions, please call [place] office at [phone] to clarify.   YOU SHOULD EXPECT: Some feelings of bloating in the abdomen. Passage of more gas than usual. Walking can help get rid of the air that was put into your GI tract during the procedure and reduce the bloating. If you had a lower endoscopy (such as a colonoscopy or flexible sigmoidoscopy) you may notice spotting of blood in your stool or on the toilet paper. Some abdominal soreness may be present for a day or two, also.  DIET: Your first meal following the procedure should be a light meal and then it is ok to progress to your normal diet. A half-sandwich or bowl of soup is an example of a good first meal. Heavy or fried foods are harder to digest and may make you feel nauseous or bloated. Drink plenty of fluids but you should avoid alcoholic beverages for 24 hours. If you had a esophageal dilation, please see attached instructions for diet.    ACTIVITY: Your care partner should take you home directly after the procedure. You should plan to take it easy, moving slowly for the rest of the day. You can resume normal activity the day after the procedure however YOU SHOULD NOT DRIVE, use power tools, machinery or perform tasks that involve climbing or major physical exertion for 24 hours (because of the sedation medicines used during the test).   SYMPTOMS TO REPORT IMMEDIATELY: A  gastroenterologist can be reached at any hour. Please call [phone]  for any of the following symptoms:  Following lower endoscopy (colonoscopy, flexible sigmoidoscopy) Excessive amounts of blood in the stool  Significant tenderness, worsening of abdominal pains  Swelling of the abdomen that is new, acute  Fever of 100 or higher    FOLLOW UP:  If any biopsies were taken you will be contacted by phone or by letter within the next 1-3 weeks. Call [phone]  if you have not heard about the biopsies in 3 weeks.  Please also call with any specific questions about appointments or follow up tests.

## 2017-07-28 NOTE — Progress Notes (Signed)
A/ox3 pleased with MAC, report to Megan RN 

## 2017-07-29 ENCOUNTER — Telehealth: Payer: Self-pay | Admitting: *Deleted

## 2017-07-29 NOTE — Telephone Encounter (Signed)
  Follow up Call-  Call back number 07/28/2017  Post procedure Call Back phone  # [phone]  Permission to leave phone message Yes  Some recent data might be hidden     Patient questions:  Do you have a fever, pain , or abdominal swelling? No. Pain Score  0 *  Have you tolerated food without any problems? Yes.    Have you been able to return to your normal activities? Yes.    Do you have any questions about your discharge instructions: Diet   No. Medications  No. Follow up visit  No.  Do you have questions or concerns about your Care? No.  Actions: * If pain score is 4 or above: No action needed, pain <4.

## 2017-08-20 ENCOUNTER — Encounter: Payer: Medicare Other | Attending: Family Medicine | Admitting: Registered"

## 2017-08-20 ENCOUNTER — Encounter: Payer: Self-pay | Admitting: Registered"

## 2017-08-20 DIAGNOSIS — R739 Hyperglycemia, unspecified: Secondary | ICD-10-CM

## 2017-08-20 NOTE — Progress Notes (Signed)
Patient was seen on 08/20/17 for the Core Session 1 of Diabetes Prevention Program course at [facility]. The following learning objectives were met by the patient during this class:   Learning Objectives:   Be able to explain the purpose and benefits of the National Diabetes Prevention Program.   Be able to describe the events that will take place at every session.   Know the weight loss and physical activity goals established by the National Diabetes Prevention Program.   Know their own individual weight loss and physical activity goals.   Be able to explain the important effect of self-monitoring on behavior change.   Goals:  Record food and beverage intake in "Food and Activity Tracker" over the next week.  Bring completed "Food and Activity Tracker" for session 1 to session 2 next week. Circle the foods or beverages you think are highest in fat and calories in your food tracker. Read the labels on the food you buy, and consider using measuring cups and spoons to help you calculate the amount you eat. We will talk about measuring in more detail in the coming weeks.   Follow-Up Plan:  Attend Core Session 2 next week.   Bring completed "Food and Activity Tracker" next week to be reviewed by Lifestyle Coach.    

## 2017-08-27 ENCOUNTER — Encounter: Payer: Medicare Other | Admitting: Registered"

## 2017-08-27 ENCOUNTER — Encounter: Payer: Self-pay | Admitting: Registered"

## 2017-08-27 DIAGNOSIS — R7303 Prediabetes: Secondary | ICD-10-CM

## 2017-08-27 NOTE — Progress Notes (Signed)
Patient was seen on 08/27/17 for the Core Session 2 of Diabetes Prevention Program course at [facility]. By the end of this session patients are able to complete the following objectives:   Learning Objectives:  Self-monitor their weight during the weeks following Session 2.   Describe the relationship between fat and calories.   Explain the reason for, and basic principles of, self-monitoring fat grams and calories.   Identify their personal fat gram goals.   Use the Fat and Calorie Counter to calculate the calories and fat grams of a given selection of foods.   Keep a running total of the fat grams they eat each day.   Calculate fat, calories, and serving sizes from nutrition labels.   Goals:   Weigh yourself at the same time each day, or every few days, and record your weight in your Food and Activity Tracker.  Write down everything you eat and drink in your Food and Activity Tracker.  Measure portions as much as you can, and start reading labels.   Use the Fat and Calorie Counter to figure out the amount of fat and calories in what you ate, and write the amount down in your Food and Activity Tracker.  Keep a running fat gram total throughout the day. Come as close to your fat gram goal as you can.   Follow-Up Plan:  Attend Core Session 3 next week.   Bring completed "Food and Activity Tracker" next week to be reviewed by Lifestyle Coach.

## 2017-09-03 ENCOUNTER — Encounter: Payer: Medicare Other | Attending: Family Medicine | Admitting: Registered"

## 2017-09-03 ENCOUNTER — Encounter: Payer: Self-pay | Admitting: Registered"

## 2017-09-03 DIAGNOSIS — R7303 Prediabetes: Secondary | ICD-10-CM

## 2017-09-03 NOTE — Progress Notes (Signed)
Patient was seen on 09/03/17 for the Core Session 3 of Diabetes Prevention Program course at [facility]. By the end of this session patients are able to complete the following objectives:   Learning Objectives:  Weigh and measure foods.  Estimate the fat and calorie content of common foods.  Describe three ways to eat less fat and fewer calories.  Create a plan to eat less fat for the following week.   Goals:   Track weight when weighing outside of class.   Track food and beverages eaten each day in Food and Activity Tracker and include fat grams and calories for each.   Try to stay without fat gram goal.   Complete plan for eating less high fat foods and answer related homework questions.    Follow-Up Plan:  Attend Core Session 4 next week.   Bring completed "Food and Activity Tracker" next week to be reviewed by Lifestyle Coach.

## 2017-09-09 ENCOUNTER — Telehealth: Payer: Self-pay | Admitting: Family Medicine

## 2017-09-09 NOTE — Telephone Encounter (Signed)
Copied from [place] [id number]. Topic: Quick Communication - Rx Refill/Question >> Sep 09, 2017  4:04 PM Arletha Grippe wrote: Medication: viagra?   Has the patient contacted their pharmacy? No.   (Agent: If no, request that the patient contact the pharmacy for the refill.)   Preferred Pharmacy (with phone number or street name): pt called - he would like to get a cheaper substitute for viagra. He is paying too much for his current rx. He would like to do an online substitute. He wants to try something called roman - it is cheaper than viagra.   Cb# [phone]    Agent: Please be advised that RX refills may take up to 3 business days. We ask that you follow-up with your pharmacy.

## 2017-09-10 ENCOUNTER — Encounter: Payer: Self-pay | Admitting: Registered"

## 2017-09-10 ENCOUNTER — Encounter: Payer: Medicare Other | Admitting: Registered"

## 2017-09-10 DIAGNOSIS — R7303 Prediabetes: Secondary | ICD-10-CM

## 2017-09-10 NOTE — Telephone Encounter (Signed)
I cannot prescribe this because I have never heard of it

## 2017-09-10 NOTE — Telephone Encounter (Signed)
Called pt and left a VM to call back.  

## 2017-09-10 NOTE — Telephone Encounter (Signed)
I spoke with pt and he is just requesting generic of Viagra to local pharmacy.

## 2017-09-10 NOTE — Telephone Encounter (Signed)
Sent to PCP ?

## 2017-09-10 NOTE — Progress Notes (Signed)
Patient was seen on 09/10/17 for the Core Session 4 of Diabetes Prevention Program course at [facility]. By the end of this session patients are able to complete the following objectives:   Learning Objectives:  Explain the health benefits of eating less fat and fewer calories.  Describe the MyPlate food guide and its recommendations, including  how to reduce fat and calories in our diet.  Compare and contrast MyPlate guidelines with participants' eating  habits.  List ways to replace high-fat and high-calorie foods with low-fat and  low-calorie foods.  Explain the importance of eating plenty of whole grains, vegetables, and  fruits, while staying within fat gram goals.  Explain the importance of eating foods from all groups of MyPlate and  of eating a variety of foods from within each group.  Explain why a balanced diet is beneficial to health.  Explain why eating the same foods over and over is not the best strategy  for long-term success.   Goals:   Record weight taken outside of class.   Track foods and beverages eaten each day in the "Food and Activity Tracker," including calories and fat grams for each item.   Practice comparing what you eat with the recommendations of MyPlate using the "Rate Your Plate" handout.   Complete the "Rate Your Plate" handout form on at least 3 days.   Answer homework questions.   Follow-Up Plan:  Attend Core Session 5 next week.   Bring completed "Food and Activity Tracker" next week to be reviewed by Lifestyle Coach.

## 2017-09-10 NOTE — Telephone Encounter (Signed)
Sent to PCP for approval.  

## 2017-09-12 NOTE — Telephone Encounter (Signed)
Call in Sildenafil 100 mg to take as needed, #10 with 11 rf 

## 2017-09-13 MED ORDER — SILDENAFIL CITRATE 100 MG PO TABS: 50.0000 mg | ORAL_TABLET | ORAL | 11 refills | Status: DC | PRN

## 2017-09-13 NOTE — Telephone Encounter (Signed)
rx has been sent 

## 2017-09-15 DIAGNOSIS — D1801 Hemangioma of skin and subcutaneous tissue: Secondary | ICD-10-CM | POA: Diagnosis not present

## 2017-09-15 DIAGNOSIS — D225 Melanocytic nevi of trunk: Secondary | ICD-10-CM | POA: Diagnosis not present

## 2017-09-15 DIAGNOSIS — L853 Xerosis cutis: Secondary | ICD-10-CM | POA: Diagnosis not present

## 2017-09-15 DIAGNOSIS — L821 Other seborrheic keratosis: Secondary | ICD-10-CM | POA: Diagnosis not present

## 2017-09-15 DIAGNOSIS — L570 Actinic keratosis: Secondary | ICD-10-CM | POA: Diagnosis not present

## 2017-09-17 ENCOUNTER — Encounter: Payer: Self-pay | Admitting: Registered"

## 2017-09-17 ENCOUNTER — Encounter: Payer: Medicare Other | Admitting: Registered"

## 2017-09-17 DIAGNOSIS — R7303 Prediabetes: Secondary | ICD-10-CM

## 2017-09-17 NOTE — Progress Notes (Signed)
Patient was seen on 09/17/17 for the Core Session 5 of Diabetes Prevention Program course at [facility]. By the end of this session patients are able to complete the following objectives:   Learning Objectives:  Establish a physical activity goal.  Explain the importance of the physical activity goal.  Describe their current level of physical activity.  Name ways that they are already physically active.  Develop personal plans for physical activity for the next week.   Goals:   Record weight taken outside of class.   Track foods and beverages eaten each day in the "Food and Activity Tracker," including calories and fat grams for each item.   Make an Activity Plan including date, specific type of activity, and length of time you plan to be active that includes at last 60 minutes of activity for the week.   Track activity type, minutes you were active, and distance you reached each day in the "Food and Activity Tracker."   Follow-Up Plan:  Attend Core Session 6 next week.   Bring completed "Food and Activity Tracker" next week to be reviewed by Lifestyle Coach.

## 2017-09-21 ENCOUNTER — Telehealth: Payer: Self-pay | Admitting: Family Medicine

## 2017-09-21 NOTE — Telephone Encounter (Signed)
I feel like we had him ask this before and you hadn't ever heard of a medication called Roman

## 2017-09-21 NOTE — Telephone Encounter (Signed)
Sent to PCP ?

## 2017-09-21 NOTE — Telephone Encounter (Signed)
Copied from [place] [phone] [phone]. Topic: Quick Communication - Rx Refill/Question >> Sep 09, 2017  4:04 PM Arletha Grippe wrote: Medication: viagra?   Has the patient contacted their pharmacy? No.   (Agent: If no, request that the patient contact the pharmacy for the refill.)   Preferred Pharmacy (with phone number or street name): pt called - he would like to get a cheaper substitute for viagra. He is paying too much for his current rx. He would like to do an online substitute. He wants to try something called roman - it is cheaper than viagra.   Cb# [id number]    Agent: Please be advised that RX refills may take up to 3 business days. We ask that you follow-up with your pharmacy. >> Sep 21, 2017  9:37 AM Robina Ade, Helene Kelp D wrote: Patient would like a new rx so he can take to a pharmacy of his choice because with [facility] it is too much out of pocket therefore he would like a hand written rx. Please call patient when its ready for pick-up.

## 2017-09-22 ENCOUNTER — Telehealth: Payer: Self-pay | Admitting: Family Medicine

## 2017-09-22 NOTE — Telephone Encounter (Signed)
Sent to PCP there is another message about the pt wanting a refill on a medication similar to viagra called Roman?   Pt stated that Viagra is too expensive

## 2017-09-22 NOTE — Telephone Encounter (Signed)
Copied from [place]. Topic: Inquiry >> Sep 22, 2017 11:32 AM Cecelia Byars, NT wrote: : Patient called to follow up on printed copy  Of  The below med ,  sildenafil (VIAGRA) 100 MG tablet

## 2017-09-23 MED ORDER — SILDENAFIL CITRATE 100 MG PO TABS: 50.0000 mg | ORAL_TABLET | ORAL | 5 refills | Status: DC | PRN

## 2017-09-23 NOTE — Telephone Encounter (Signed)
Called and pt. Pt stated that yes he would like a written Rx he will come by the office to pick it up today. Sent to PCP.

## 2017-09-23 NOTE — Telephone Encounter (Signed)
Rx placed up for pick up. Pt advised and voiced understanding.

## 2017-09-23 NOTE — Telephone Encounter (Signed)
Exactly. I have never heard of this. I can print out a rx for Sildenafil of he wishes

## 2017-09-23 NOTE — Addendum Note (Signed)
Addended by: Alysia Penna A on: 09/23/2017 12:50 PM   Modules accepted: Orders

## 2017-09-23 NOTE — Telephone Encounter (Signed)
done

## 2017-09-24 ENCOUNTER — Encounter: Payer: Self-pay | Admitting: Registered"

## 2017-09-24 ENCOUNTER — Encounter: Payer: Medicare Other | Admitting: Registered"

## 2017-09-24 DIAGNOSIS — R7303 Prediabetes: Secondary | ICD-10-CM

## 2017-09-24 NOTE — Progress Notes (Signed)
Patient was seen on 09/24/17 for the Core Session 6 of Diabetes Prevention Program course at [facility]. By the end of this session patients are able to complete the following objectives:   Learning Objectives:  Graph their daily physical activity.   Describe two ways of finding the time to be active.   Define "lifestyle activity."   Describe how to prevent injury.   Develop an activity plan for the coming week.   Goals:   Record weight taken outside of class.   Track foods and beverages eaten each day in the "Food and Activity Tracker," including calories and fat grams for each item.    Track activity type, minutes you were active, and distance you reached each day in the "Food and Activity Tracker."   Set aside one 20 to 30-minute block of time every day or find two or more periods of 10 to15 minutes each for physical activity.   Warm up, cool down, and stretch.  Make a Physical Activities Plan for the Week.   Follow-Up Plan:  Attend Core Session 7 next week.   Bring completed "Food and Activity Tracker" next week to be reviewed by Lifestyle Coach.

## 2017-09-24 NOTE — Telephone Encounter (Signed)
Pt calling back again and did not want sildenafil (VIAGRA Sent to [facility] mail order pharmacy because it was going to be $1000.00. PT would like a written rx and to be able to pick that rx up at the front desk.

## 2017-09-24 NOTE — Telephone Encounter (Signed)
Copied from [place]. Topic: Inquiry >> Sep 24, 2017  2:22 PM Boyd Kerbs wrote: [facility] [address], [place] [address]  [place] [place] [address]  Phone: [phone] Fax: [phone]    Pt was saying something about his prescription and [facility].Marland Kitchen  He was very hard to understand and wants to speak to Dr. Barbie Banner nurse

## 2017-09-27 NOTE — Telephone Encounter (Signed)
Called and spoke with pt he is fine was able to pick up his medication. OK to close out this message.

## 2017-10-01 ENCOUNTER — Encounter: Payer: Self-pay | Admitting: Registered"

## 2017-10-01 ENCOUNTER — Encounter: Payer: Medicare Other | Attending: Family Medicine | Admitting: Registered"

## 2017-10-01 DIAGNOSIS — R7303 Prediabetes: Secondary | ICD-10-CM

## 2017-10-01 NOTE — Progress Notes (Signed)
Patient was seen on 10/01/17 for the Core Session 7 of Diabetes Prevention Program course at [facility]. By the end of this session patients are able to complete the following objectives:   Learning Objectives:  Define calorie balance.  Explain how healthy eating and being active are related in terms of calorie balance.   Describe the relationship between calorie balance and weight loss.   Describe his or her progress as it relates to calorie balance.   Develop an activity plan for the coming week.   Goals:   Record weight taken outside of class.   Track foods and beverages eaten each day in the "Food and Activity Tracker," including calories and fat grams for each item.    Track activity type, minutes you were active, and distance you reached each day in the "Food and Activity Tracker."   Set aside one 20 to 30-minute block of time every day or find two or more periods of 10 to15 minutes each for physical activity.   Make a Physical Activities Plan for the Week.   Make active lifestyle choices all through the day   Stay at or go slightly over activity goal.   Follow-Up Plan:  Attend Core Session 8 next week.   Bring completed "Food and Activity Tracker" next week to be reviewed by Lifestyle Coach.

## 2017-10-08 ENCOUNTER — Encounter: Payer: Medicare Other | Admitting: Registered"

## 2017-10-08 ENCOUNTER — Encounter: Payer: Self-pay | Admitting: Registered"

## 2017-10-08 DIAGNOSIS — R7303 Prediabetes: Secondary | ICD-10-CM

## 2017-10-08 NOTE — Progress Notes (Signed)
Patient was seen on 10/08/17 for the Core Session 8 of Diabetes Prevention Program course at [facility]. By the end of this session patients are able to complete the following objectives:   Learning Objectives:  Recognize positive and negative food and activity cues.   Change negative food and activity cues to positive cues.   Add positive cues for activity and eliminate cues for inactivity.   Develop a plan for removing one problem food cue for the coming week.   Goals:   Record weight taken outside of class.   Track foods and beverages eaten each day in the "Food and Activity Tracker," including calories and fat grams for each item.    Track activity type, minutes you were active, and distance you reached each day in the "Food and Activity Tracker."   Set aside one 20 to 30-minute block of time every day or find two or more periods of 10 to15 minutes each for physical activity.   Remove one problem food cue.   Add one positive cue for being more active.  Follow-Up Plan:  Attend Core Session 9 next week.   Bring completed "Food and Activity Tracker" next week to be reviewed by Lifestyle Coach.

## 2017-10-15 ENCOUNTER — Encounter: Payer: Medicare Other | Admitting: Registered"

## 2017-10-15 ENCOUNTER — Encounter: Payer: Self-pay | Admitting: Registered"

## 2017-10-15 DIAGNOSIS — R7303 Prediabetes: Secondary | ICD-10-CM | POA: Diagnosis not present

## 2017-10-15 NOTE — Progress Notes (Signed)
Patient was seen on 10/15/17 for the Core Session 9 of Diabetes Prevention Program course at [facility]. By the end of this session patients are able to complete the following objectives:   Learning Objectives:  List and describe five steps to problem solving.   Apply the five problem solving steps to resolve a problem he or she has with eating less fat and fewer calories or being more active.   Goals:   Record weight taken outside of class.   Track foods and beverages eaten each day in the "Food and Activity Tracker," including calories and fat grams for each item.    Track activity type, minutes you were active, and distance you reached each day in the "Food and Activity Tracker."   Set aside one 20 to 30-minute block of time every day or find two or more periods of 10 to15 minutes each for physical activity.   Use problem solving action plan created during session to problem solve.   Follow-Up Plan:  Attend Core Session 10 next week.   Bring completed "Food and Activity Tracker" next week to be reviewed by Lifestyle Coach.  Bring menus from favorite restaurants to next session for future discussion.

## 2017-10-22 ENCOUNTER — Encounter: Payer: Medicare Other | Admitting: Registered"

## 2017-10-22 ENCOUNTER — Encounter: Payer: Self-pay | Admitting: Registered"

## 2017-10-22 DIAGNOSIS — R7303 Prediabetes: Secondary | ICD-10-CM | POA: Diagnosis not present

## 2017-10-22 NOTE — Progress Notes (Signed)
Patient was seen on 10/22/17 for the Core Session 10 of Diabetes Prevention Program course at [facility]. By the end of this session patients are able to complete the following objectives:   Learning Objectives:  List and describe the four keys for healthy eating out.   Give examples of how to apply these keys at the type of restaurants that the participants go to regularly.   Make an appropriate meal selection from a restaurant menu.   Demonstrate how to ask for a substitute item using assertive language and a polite tone of voice.    Goals:   Record weight taken outside of class.   Track foods and beverages eaten each day in the "Food and Activity Tracker," including calories and fat grams for each item.    Track activity type, minutes you were active, and distance you reached each day in the "Food and Activity Tracker."   Set aside one 20 to 30-minute block of time every day or find two or more periods of 10 to15 minutes each for physical activity.   Utilize positive action plan and complete questions on "To Do List."   Follow-Up Plan:  Attend Core Session 11 next week.   Bring completed "Food and Activity Tracker" next week to be reviewed by Lifestyle Coach.

## 2017-10-29 ENCOUNTER — Encounter: Payer: Medicare Other | Attending: Family Medicine | Admitting: Registered"

## 2017-10-29 ENCOUNTER — Encounter: Payer: Self-pay | Admitting: Registered"

## 2017-10-29 DIAGNOSIS — R7303 Prediabetes: Secondary | ICD-10-CM

## 2017-10-29 NOTE — Progress Notes (Signed)
Patient was seen on 10/29/17 for the Core Session 11 of Diabetes Prevention Program course at [facility]. By the end of this session patients are able to complete the following objectives:   Learning Objectives:  Give examples of negative thoughts that could prevent them from meeting their goals of losing weight and being more physically active.   Describe how to stop negative thoughts and talk back to them with positive thoughts.   Practice 1) stopping negative thoughts and 2) talking back to negative thoughts with positive ones.    Goals:   Record weight taken outside of class.   Track foods and beverages eaten each day in the "Food and Activity Tracker," including calories and fat grams for each item.    Track activity type, minutes you were active, and distance you reached each day in the "Food and Activity Tracker."   If you have any negative thoughts-write them in your Food and Activity Trackers, along with how you talked back to them. Practice stopping negative thoughts and talking back to them with positive thoughts.   Follow-Up Plan:  Attend Core Session 12 next week.   Bring completed "Food and Activity Tracker" next week to be reviewed by Lifestyle Coach.

## 2017-11-05 ENCOUNTER — Encounter: Payer: Self-pay | Admitting: Registered"

## 2017-11-05 ENCOUNTER — Encounter: Payer: Medicare Other | Admitting: Registered"

## 2017-11-05 DIAGNOSIS — R7303 Prediabetes: Secondary | ICD-10-CM

## 2017-11-05 NOTE — Progress Notes (Signed)
Patient was seen on 11/05/17 for the Core Session 12 of Diabetes Prevention Program course at [facility]. By the end of this session patients are able to complete the following objectives:   Learning Objectives:  Describe their current progress toward defined goals.  Describe common causes for slipping from healthy eating or being  active.  Explain what to do to get back on their feet after a slip.  Goals:   Record weight taken outside of class.   Track foods and beverages eaten each day in the "Food and Activity Tracker," including calories and fat grams for each item.    Track activity type, minutes active, and distance reached each day in the "Food and Activity Tracker."   Try out the two action plans created during session- "Slips from Healthy Eating: Action Plan" and "Slips from Being Active: Action Plan"  Answer questions on the handout.   Follow-Up Plan:  Attend Core Session 13 next week.   Bring completed "Food and Activity Tracker" next week to be reviewed by Lifestyle Coach.

## 2017-11-12 ENCOUNTER — Encounter: Payer: Medicare Other | Admitting: Registered"

## 2017-11-12 ENCOUNTER — Encounter: Payer: Self-pay | Admitting: Registered"

## 2017-11-12 DIAGNOSIS — R7303 Prediabetes: Secondary | ICD-10-CM

## 2017-11-12 NOTE — Progress Notes (Signed)
Patient was seen on 11/12/17 for the Core Session 13 of Diabetes Prevention Program course at [facility]. By the end of this session patients are able to complete the following objectives:   Learning Objectives:  Describe ways to add interest and variety to their activity plans.  Define ?aerobic fitness.  Explain the four F.I.T.T. principles (frequency, intensity, time, and type of activity) and how they relate to aerobic fitness.   Goals:   Record weight taken outside of class.   Track foods and beverages eaten each day in the "Food and Activity Tracker," including calories and fat grams for each item.    Track activity type, minutes you were active, and distance you reached each day in the "Food and Activity Tracker."   Do your best to reach activity goal for the week.  Use one of the F.I.T.T. principles to jump start workouts.  Document activity level on the "To Do Next Week" handout.  Follow-Up Plan:  Attend Core Session 14 next week.   Bring completed "Food and Activity Tracker" next week to be reviewed by Lifestyle Coach.

## 2017-11-19 ENCOUNTER — Encounter: Payer: Medicare Other | Admitting: Registered"

## 2017-11-19 ENCOUNTER — Encounter: Payer: Self-pay | Admitting: Registered"

## 2017-11-19 DIAGNOSIS — R7303 Prediabetes: Secondary | ICD-10-CM

## 2017-11-19 NOTE — Progress Notes (Signed)
Patient was seen on 11/19/17 for the Core Session 14 of Diabetes Prevention Program course at [facility]. By the end of this session patients are able to complete the following objectives:   Learning Objectives:  Give examples of problem social cues and helpful social cues.   Explain how to remove problem social cues and add helpful ones.   Describe ways of coping with vacations and social events such as parties, holidays, and visits from relatives and friends.   Create an action plan to change a problem social cue and add a helpful one.   Goals:   Record weight taken outside of class.   Track foods and beverages eaten each day in the "Food and Activity Tracker," including calories and fat grams for each item.    Track activity type, minutes you were active, and distance you reached each day in the "Food and Activity Tracker."   Do your best to reach activity goal for the week.  Use action plan created during session to change a problem social cue and add a helpful social cue.   Answer questions regarding success of changing social cues on "To Do Next Week" handout.   Follow-Up Plan:  Attend Core Session 15 next week.   Bring completed "Food and Activity Tracker" next week to be reviewed by Lifestyle Coach.

## 2017-11-26 ENCOUNTER — Encounter: Payer: Self-pay | Admitting: Registered"

## 2017-11-26 ENCOUNTER — Encounter (HOSPITAL_BASED_OUTPATIENT_CLINIC_OR_DEPARTMENT_OTHER): Payer: Medicare Other | Admitting: Registered"

## 2017-11-26 DIAGNOSIS — R7303 Prediabetes: Secondary | ICD-10-CM

## 2017-11-26 NOTE — Progress Notes (Signed)
Patient was seen on 11/26/17 for the Core Session 15 of Diabetes Prevention Program course at [facility]. By the end of this session patients are able to complete the following objectives:   Learning Objectives:  Explain how to prevent stress or cope with unavoidable stress.   Describe how this program can be a source of stress.   Explain how to manage stressful situations.   Create and follow an action plan for either preventing or coping with a stressful situation.   Goals:   Record weight taken outside of class.   Track foods and beverages eaten each day in the "Food and Activity Tracker," including calories and fat grams for each item.    Track activity type, minutes you were active, and distance you reached each day in the "Food and Activity Tracker."   Do your best to reach activity goal for the week.  Follow your action plan to reduce stress.   Answer questions on handout regarding success of action plan.   Follow-Up Plan:  Attend Core Session 16 next week.   Bring completed "Food and Activity Tracker" next week to be reviewed by Lifestyle Coach.

## 2017-12-03 ENCOUNTER — Encounter: Payer: Self-pay | Admitting: Registered"

## 2017-12-03 ENCOUNTER — Encounter: Payer: Medicare Other | Attending: Family Medicine | Admitting: Registered"

## 2017-12-03 DIAGNOSIS — R7303 Prediabetes: Secondary | ICD-10-CM

## 2017-12-03 NOTE — Progress Notes (Addendum)
Patient was seen on 12/03/17 for the Core Session 16 of Diabetes Prevention Program course at [facility]. By the end of this session patients are able to complete the following objectives:   Learning Objectives:  Measure their progress toward weight and physical activity goals since Session 1.   Develop a plan for improving progress, if their goals have not yet been attained.   Describe ways to stay motivated long-term.   Goals:   Record weight taken outside of class.   Track foods and beverages eaten each day in the "Food and Activity Tracker," including calories and fat grams for each item.    Track activity type, minutes you were active, and distance you reached each day in the "Food and Activity Tracker."   Utilize action plan to help stay motivated and complete questions on "To Do List."   Follow-Up Plan:  Attend session 17 in two weeks.   Bring completed "Food and Activity Tracker" next session to be reviewed by Lifestyle Coach.

## 2017-12-17 ENCOUNTER — Encounter (HOSPITAL_BASED_OUTPATIENT_CLINIC_OR_DEPARTMENT_OTHER): Payer: Medicare Other | Admitting: Registered"

## 2017-12-17 ENCOUNTER — Encounter: Payer: Self-pay | Admitting: Registered"

## 2017-12-17 DIAGNOSIS — R7303 Prediabetes: Secondary | ICD-10-CM

## 2017-12-17 NOTE — Progress Notes (Signed)
Patient was seen on 12/17/17 for Session 17 of Diabetes Prevention Program course at [facility]. By the end of this session patients are able to complete the following objectives:   Learning Objectives:  Identify how to maintain and/or continue working toward program goals for the remainder of the program.   Describe ways that food and activity tracking can assist them in maintaining/reaching program goals.   Identify progress they have made since the beginning of the program.   Goals:   Record weight taken outside of class.   Track foods and beverages eaten each day in the "Food and Activity Tracker," including calories and fat grams for each item.    Track activity type, minutes you were active, and distance you reached each day in the "Food and Activity Tracker."   Follow-Up Plan:  Attend session 18 in two weeks.   Bring completed "Food and Activity Trackers" next session to be reviewed by Lifestyle Coach.

## 2017-12-31 ENCOUNTER — Encounter: Payer: Self-pay | Admitting: Registered"

## 2017-12-31 ENCOUNTER — Encounter: Payer: Medicare Other | Attending: Family Medicine | Admitting: Registered"

## 2017-12-31 DIAGNOSIS — R7303 Prediabetes: Secondary | ICD-10-CM

## 2017-12-31 NOTE — Progress Notes (Signed)
Patient was seen on 12/31/17 for Session 18 of Diabetes Prevention Program course at [facility]. By the end of this session patients are able to complete the following objectives:   Learning Objectives:  Explain how glucose is used in the body and it's relationship with insulin/insulin resistance.   Identify symptoms of diabetes.   Describe lab tests used to diagnose diabetes.   Describe health complications and conditions related to diabetes.   Goals:   Record weight taken outside of class.   Track foods and beverages eaten each day in the "Food and Activity Tracker," including calories and fat grams for each item.    Track activity type, minutes you were active, and distance you reached each day in the "Food and Activity Tracker."   Follow-Up Plan:  Attend session 19 in two weeks.   Bring completed "Food and Activity Trackers" to next session to be reviewed by Lifestyle Coach.

## 2018-01-14 ENCOUNTER — Ambulatory Visit (INDEPENDENT_AMBULATORY_CARE_PROVIDER_SITE_OTHER): Payer: Medicare Other | Admitting: Neurology

## 2018-01-14 ENCOUNTER — Encounter: Payer: Self-pay | Admitting: Neurology

## 2018-01-14 ENCOUNTER — Other Ambulatory Visit: Payer: Self-pay

## 2018-01-14 VITALS — BP 122/76 | HR 78 | Ht 73.0 in | Wt 274.0 lb

## 2018-01-14 DIAGNOSIS — G40109 Localization-related (focal) (partial) symptomatic epilepsy and epileptic syndromes with simple partial seizures, not intractable, without status epilepticus: Secondary | ICD-10-CM

## 2018-01-14 DIAGNOSIS — Z87820 Personal history of traumatic brain injury: Secondary | ICD-10-CM | POA: Diagnosis not present

## 2018-01-14 MED ORDER — ZONISAMIDE 100 MG PO CAPS: ORAL_CAPSULE | ORAL | 3 refills | Status: DC
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
## 2018-01-17 ENCOUNTER — Telehealth: Payer: Self-pay | Admitting: Neurology

## 2018-01-17 ENCOUNTER — Other Ambulatory Visit: Payer: Self-pay

## 2018-01-17 DIAGNOSIS — G40109 Localization-related (focal) (partial) symptomatic epilepsy and epileptic syndromes with simple partial seizures, not intractable, without status epilepticus: Secondary | ICD-10-CM

## 2018-01-17 MED ORDER — ZONISAMIDE 100 MG PO CAPS: ORAL_CAPSULE | ORAL | 0 refills | Status: DC

## 2018-01-17 NOTE — Telephone Encounter (Signed)
30 day supply of Zonisamide sent to [facility] on [address].

## 2018-01-17 NOTE — Telephone Encounter (Signed)
Patient mother called and states that she needs [place] to call in the patient seizure medication (she did not know the name of it ) to the [facility] on [address]. She states that we changed the dosage and the mail order will take 2 weeks to get it to them so if we could just call in a couple of weeks to him to he gets his from the mail order. He is out of medication today

## 2018-02-04 ENCOUNTER — Encounter: Payer: Self-pay | Admitting: Registered"

## 2018-02-04 ENCOUNTER — Encounter: Payer: Medicare Other | Attending: Family Medicine | Admitting: Registered"

## 2018-02-04 DIAGNOSIS — R7303 Prediabetes: Secondary | ICD-10-CM

## 2018-02-04 NOTE — Progress Notes (Signed)
Patient was seen on 02/04/18 for Session 20 of Diabetes Prevention Program course at [facility]. By the end of this session patients are able to complete the following objectives:   Learning Objectives:  Define fiber and describe the difference between insoluble and soluble fiber   List foods that are good sources of fiber  Explain the health benefits of fiber   Describe ways to increase volume of meals and snacks while staying within fat goal.   Goals:   Record weight taken outside of class.   Track foods and beverages eaten each day in the "Food and Activity Tracker," including calories and fat grams for each item.    Track activity type, minutes you were active, and distance you reached each day in the "Food and Activity Tracker."   Follow-Up Plan:  Attend session 21  Bring completed "Food and Activity Trackers" to next session to be reviewed by Lifestyle Coach.

## 2018-03-04 ENCOUNTER — Encounter: Payer: Medicare Other | Admitting: Registered"

## 2018-03-18 ENCOUNTER — Encounter: Payer: Medicare Other | Attending: Family Medicine | Admitting: Registered"

## 2018-03-18 ENCOUNTER — Encounter: Payer: Self-pay | Admitting: Registered"

## 2018-03-18 DIAGNOSIS — R7303 Prediabetes: Secondary | ICD-10-CM

## 2018-03-18 NOTE — Progress Notes (Signed)
Patient was seen on 03/18/18 for Session 22 of Diabetes Prevention Program course at [facility]. By the end of this session patients are able to complete the following objectives:   Learning Objectives:  Describe the difference between a lapse and a relapse.  List steps to prevent a lapse from becoming a relapse.   Identify situations that increase risk of having a lapse.   Make a plan to help prevent lapses and recover after a lapse has occurred.   Goals:   Record weight taken outside of class.   Track foods and beverages eaten each day in the "Food and Activity Tracker," including calories and fat grams for each item.    Track activity type, minutes you were active, and distance you reached each day in the "Food and Activity Tracker."   Follow-Up Plan:  Attend session 23  Bring completed "Food and Activity Trackers" to next session to be reviewed by Lifestyle Coach.

## 2018-04-01 ENCOUNTER — Encounter: Payer: Medicare Other | Admitting: Registered"

## 2018-04-15 ENCOUNTER — Encounter: Payer: Medicare Other | Attending: Family Medicine | Admitting: Registered"

## 2018-04-15 ENCOUNTER — Encounter: Payer: Self-pay | Admitting: Registered"

## 2018-04-15 DIAGNOSIS — R7303 Prediabetes: Secondary | ICD-10-CM | POA: Diagnosis not present

## 2018-04-15 NOTE — Progress Notes (Signed)
Patient was seen on 04/15/18 for the Diabetes Prevention Program course at [facility]. By the end of this session patients are able to complete the following objectives:   Learning Objectives:  Describe the importance of having regular meals each day and how skipping meals can negatively affect food choices and weight.   Plan out balanced meals and snacks.  List ways to avoid unplanned snacking.   Goals:   Record weight taken outside of class.   Track foods and beverages eaten each day in the "Food and Activity Tracker," including calories and fat grams for each item.    Track activity type, minutes you were active, and distance you reached each day in the "Food and Activity Tracker."   Follow-Up Plan:  Attend next session.   Bring completed "Food and Activity Trackers" to next session to be reviewed by Lifestyle Coach.

## 2018-04-18 ENCOUNTER — Encounter: Payer: Self-pay | Admitting: Family Medicine

## 2018-04-18 ENCOUNTER — Ambulatory Visit (INDEPENDENT_AMBULATORY_CARE_PROVIDER_SITE_OTHER): Payer: Medicare Other | Admitting: Family Medicine

## 2018-04-18 VITALS — BP 114/76 | HR 72 | Temp 95.9°F | Ht 73.0 in | Wt 267.0 lb

## 2018-04-18 DIAGNOSIS — H6122 Impacted cerumen, left ear: Secondary | ICD-10-CM

## 2018-04-18 NOTE — Progress Notes (Signed)
   Subjective:    Patient ID: Patrick Pearson, male    DOB: 05/27/1966, 52 y.o.   MRN: [id number]  HPI Here for one week of pressure and decreased hearing in the left ear. No sinus symptoms.    Review of Systems  Constitutional: Negative.   HENT: Positive for hearing loss. Negative for congestion, ear discharge, ear pain, postnasal drip, sinus pressure, sinus pain and sore throat.   Eyes: Negative.   Respiratory: Negative.        Objective:   Physical Exam  Constitutional: He appears well-developed and well-nourished.  HENT:  Right Ear: External ear normal.  Nose: Nose normal.  Mouth/Throat: Oropharynx is clear and moist.  Left ear canal is full of cerumen  Eyes: Conjunctivae are normal.  Neck: No thyromegaly present.  Pulmonary/Chest: Effort normal and breath sounds normal.  Lymphadenopathy:    He has no cervical adenopathy.          Assessment & Plan:  Cerumen impaction. We attempted to irrigate the canal with water but were unsuccessful. We will refer him to ENT to remove this.  Alysia Penna, MD

## 2018-04-20 DIAGNOSIS — Z87891 Personal history of nicotine dependence: Secondary | ICD-10-CM | POA: Diagnosis not present

## 2018-04-20 DIAGNOSIS — T162XXA Foreign body in left ear, initial encounter: Secondary | ICD-10-CM | POA: Diagnosis not present

## 2018-05-06 ENCOUNTER — Encounter: Payer: Self-pay | Admitting: Registered"

## 2018-05-06 ENCOUNTER — Encounter: Payer: Medicare Other | Attending: Family Medicine | Admitting: Registered"

## 2018-05-06 DIAGNOSIS — R7303 Prediabetes: Secondary | ICD-10-CM | POA: Diagnosis not present

## 2018-05-06 NOTE — Progress Notes (Signed)
Patient was seen on 05/06/18 for the Diabetes Prevention Program course at [facility]. By the end of this session patients are able to complete the following objectives:   Learning Objectives:  Identify common risk factors for heart disease.   Describe the different between LDL and HDL cholesterol.   List 7 ways to help prevent heart disease.   Goals:   Record weight taken outside of class.   Track foods and beverages eaten each day in the "Food and Activity Tracker," including calories and fat grams for each item.    Track activity type, minutes you were active, and distance you reached each day in the "Food and Activity Tracker."   Follow-Up Plan:  Attend next session.   Bring completed "Food and Activity Trackers" to next session to be reviewed by Lifestyle Coach.

## 2018-05-19 ENCOUNTER — Telehealth: Payer: Self-pay | Admitting: Family Medicine

## 2018-05-19 DIAGNOSIS — R7303 Prediabetes: Secondary | ICD-10-CM

## 2018-05-19 NOTE — Telephone Encounter (Signed)
Dr. Sarajane Jews please advise of referral. Thanks

## 2018-05-19 NOTE — Telephone Encounter (Signed)
Copied from [place] [id number]. Topic: Referral - Request >> May 19, 2018  9:02 AM Bea Graff, NT wrote: Reason for CRM: Pamala Hurry with [facility] calling to request a new referral for this pt to continue the DPP program. Fax#: [phone] CB#: [phone]

## 2018-05-20 NOTE — Telephone Encounter (Signed)
Done

## 2018-05-20 NOTE — Addendum Note (Signed)
Addended by: Alysia Penna A on: 05/20/2018 08:54 AM   Modules accepted: Orders

## 2018-05-26 ENCOUNTER — Other Ambulatory Visit (INDEPENDENT_AMBULATORY_CARE_PROVIDER_SITE_OTHER): Payer: Medicare Other

## 2018-05-26 DIAGNOSIS — N138 Other obstructive and reflux uropathy: Secondary | ICD-10-CM | POA: Diagnosis not present

## 2018-05-26 DIAGNOSIS — N401 Enlarged prostate with lower urinary tract symptoms: Secondary | ICD-10-CM

## 2018-05-26 DIAGNOSIS — R7309 Other abnormal glucose: Secondary | ICD-10-CM

## 2018-05-26 DIAGNOSIS — E785 Hyperlipidemia, unspecified: Secondary | ICD-10-CM | POA: Diagnosis not present

## 2018-05-26 LAB — POC URINALSYSI DIPSTICK (AUTOMATED)
Blood, UA: NEGATIVE
GLUCOSE UA: NEGATIVE
KETONES UA: NEGATIVE
LEUKOCYTES UA: NEGATIVE
Nitrite, UA: NEGATIVE
Protein, UA: POSITIVE — AB
Urobilinogen, UA: 0.2 E.U./dL
pH, UA: 6 (ref 5.0–8.0)

## 2018-05-26 LAB — CBC WITH DIFFERENTIAL/PLATELET
BASOS PCT: 0.7 % (ref 0.0–3.0)
Basophils Absolute: 0 10*3/uL (ref 0.0–0.1)
EOS PCT: 1.5 % (ref 0.0–5.0)
Eosinophils Absolute: 0.1 10*3/uL (ref 0.0–0.7)
HEMATOCRIT: 43.2 % (ref 39.0–52.0)
HEMOGLOBIN: 14.3 g/dL (ref 13.0–17.0)
LYMPHS PCT: 31.9 % (ref 12.0–46.0)
Lymphs Abs: 2 10*3/uL (ref 0.7–4.0)
MCHC: 33 g/dL (ref 30.0–36.0)
MCV: 85.8 fl (ref 78.0–100.0)
MONOS PCT: 8.7 % (ref 3.0–12.0)
Monocytes Absolute: 0.5 10*3/uL (ref 0.1–1.0)
Neutro Abs: 3.5 10*3/uL (ref 1.4–7.7)
Neutrophils Relative %: 57.2 % (ref 43.0–77.0)
Platelets: 242 10*3/uL (ref 150.0–400.0)
RBC: 5.04 Mil/uL (ref 4.22–5.81)
RDW: 15 % (ref 11.5–15.5)
WBC: 6.1 10*3/uL (ref 4.0–10.5)

## 2018-05-26 LAB — LIPID PANEL
CHOLESTEROL: 116 mg/dL (ref 0–200)
HDL: 32.5 mg/dL — ABNORMAL LOW (ref >39.00)
LDL Cholesterol: 64 mg/dL (ref 0–99)
NonHDL: 83.15
Total CHOL/HDL Ratio: 4
Triglycerides: 95 mg/dL (ref 0.0–149.0)
VLDL: 19 mg/dL (ref 0.0–40.0)

## 2018-05-26 LAB — TSH: TSH: 2.25 u[IU]/mL (ref 0.35–4.50)

## 2018-05-26 LAB — HEPATIC FUNCTION PANEL
ALBUMIN: 4.4 g/dL (ref 3.5–5.2)
ALK PHOS: 62 U/L (ref 39–117)
ALT: 38 U/L (ref 0–53)
AST: 24 U/L (ref 0–37)
BILIRUBIN DIRECT: 0.1 mg/dL (ref 0.0–0.3)
TOTAL PROTEIN: 6.4 g/dL (ref 6.0–8.3)
Total Bilirubin: 0.5 mg/dL (ref 0.2–1.2)

## 2018-05-26 LAB — PSA: PSA: 0.98 ng/mL (ref 0.10–4.00)

## 2018-05-26 LAB — BASIC METABOLIC PANEL
BUN: 18 mg/dL (ref 6–23)
CALCIUM: 9.5 mg/dL (ref 8.4–10.5)
CHLORIDE: 106 meq/L (ref 96–112)
CO2: 28 mEq/L (ref 19–32)
CREATININE: 1.35 mg/dL (ref 0.40–1.50)
GFR: 59.13 mL/min — AB (ref >60.00)
GLUCOSE: 104 mg/dL — AB (ref 70–99)
Potassium: 3.9 mEq/L (ref 3.5–5.1)
SODIUM: 142 meq/L (ref 135–145)

## 2018-05-26 LAB — HEMOGLOBIN A1C: Hgb A1c MFr Bld: 6.2 % (ref 4.6–6.5)

## 2018-05-30 ENCOUNTER — Ambulatory Visit (INDEPENDENT_AMBULATORY_CARE_PROVIDER_SITE_OTHER): Payer: Medicare Other | Admitting: Family Medicine

## 2018-05-30 ENCOUNTER — Encounter: Payer: Self-pay | Admitting: Family Medicine

## 2018-05-30 VITALS — BP 106/68 | HR 72 | Temp 99.8°F | Ht 73.0 in | Wt 267.5 lb

## 2018-05-30 DIAGNOSIS — N401 Enlarged prostate with lower urinary tract symptoms: Secondary | ICD-10-CM

## 2018-05-30 DIAGNOSIS — I10 Essential (primary) hypertension: Secondary | ICD-10-CM

## 2018-05-30 DIAGNOSIS — R7309 Other abnormal glucose: Secondary | ICD-10-CM

## 2018-05-30 DIAGNOSIS — E785 Hyperlipidemia, unspecified: Secondary | ICD-10-CM | POA: Diagnosis not present

## 2018-05-30 DIAGNOSIS — S069X9D Unspecified intracranial injury with loss of consciousness of unspecified duration, subsequent encounter: Secondary | ICD-10-CM

## 2018-05-30 DIAGNOSIS — N138 Other obstructive and reflux uropathy: Secondary | ICD-10-CM | POA: Diagnosis not present

## 2018-05-30 DIAGNOSIS — F528 Other sexual dysfunction not due to a substance or known physiological condition: Secondary | ICD-10-CM

## 2018-05-30 DIAGNOSIS — Z23 Encounter for immunization: Secondary | ICD-10-CM

## 2018-05-30 MED ORDER — ROSUVASTATIN CALCIUM 40 MG PO TABS: 40.0000 mg | ORAL_TABLET | Freq: Every day | ORAL | 3 refills | Status: DC

## 2018-05-30 MED ORDER — LISINOPRIL-HYDROCHLOROTHIAZIDE 20-25 MG PO TABS: ORAL_TABLET | ORAL | 3 refills | Status: DC

## 2018-05-30 NOTE — Progress Notes (Signed)
   Subjective:    Patient ID: Patrick Pearson, male    DOB: 11-Jun-1966, 52 y.o.   MRN: [id number]  HPI Here to follow up on issues. He feels well. His BP is stable. His recent labs showed an A1c of 6.2 and an LDL of 68.   Review of Systems  Constitutional: Negative.   HENT: Negative.   Eyes: Negative.   Respiratory: Negative.   Cardiovascular: Negative.   Gastrointestinal: Negative.   Genitourinary: Negative.   Musculoskeletal: Negative.   Skin: Negative.   Neurological: Negative.   Psychiatric/Behavioral: Negative.        Objective:   Physical Exam  Constitutional: He is oriented to person, place, and time. He appears well-developed and well-nourished. No distress.  HENT:  Head: Normocephalic and atraumatic.  Right Ear: External ear normal.  Left Ear: External ear normal.  Nose: Nose normal.  Mouth/Throat: Oropharynx is clear and moist. No oropharyngeal exudate.  Eyes: Pupils are equal, round, and reactive to light. Conjunctivae and EOM are normal. Right eye exhibits no discharge. Left eye exhibits no discharge. No scleral icterus.  Neck: Neck supple. No JVD present. No tracheal deviation present. No thyromegaly present.  Cardiovascular: Normal rate, regular rhythm, normal heart sounds and intact distal pulses. Exam reveals no gallop and no friction rub.  No murmur heard. Pulmonary/Chest: Effort normal and breath sounds normal. No respiratory distress. He has no wheezes. He has no rales. He exhibits no tenderness.  Abdominal: Soft. Bowel sounds are normal. He exhibits no distension and no mass. There is no tenderness. There is no rebound and no guarding.  Genitourinary: Rectum normal, prostate normal and penis normal. Rectal exam shows guaiac negative stool. No penile tenderness.  Musculoskeletal: Normal range of motion. He exhibits no edema or tenderness.  Lymphadenopathy:    He has no cervical adenopathy.  Neurological: He is alert and oriented to person, place, and time. He  has normal reflexes. He displays normal reflexes. No cranial nerve deficit. He exhibits normal muscle tone. Coordination normal.  Skin: Skin is warm and dry. No rash noted. He is not diaphoretic. No erythema. No pallor.  Psychiatric: He has a normal mood and affect. His behavior is normal. Judgment and thought content normal.          Assessment & Plan:  His HTN is stable. His cholesterol is well controlled. Medications were refilled. Given a flu shot. Alysia Penna, MD

## 2018-06-03 ENCOUNTER — Encounter: Payer: Medicare Other | Attending: Family Medicine | Admitting: Registered"

## 2018-06-03 ENCOUNTER — Encounter: Payer: Self-pay | Admitting: Registered"

## 2018-06-03 DIAGNOSIS — R7303 Prediabetes: Secondary | ICD-10-CM

## 2018-06-03 NOTE — Progress Notes (Signed)
Patient was seen on 06/03/18 for the Diabetes Prevention Program course at [facility]. By the end of this session patients are able to complete the following objectives:   Learning Objectives:  Describe how to incorporate more fruits and vegetables into meals.  List criteria for selecting good quality fruits and vegetables at the store.   Define mindful eating.  List the benefits of eating mindfully.   Goals:   Record weight taken outside of class.   Track foods and beverages eaten each day in the "Food and Activity Tracker," including calories and fat grams for each item.    Track activity type, minutes you were active, and distance you reached each day in the "Food and Activity Tracker."   Follow-Up Plan:  Attend next session.   Bring completed "Food and Activity Trackers" to next session to be reviewed by Lifestyle Coach.

## 2018-07-01 ENCOUNTER — Encounter: Payer: Medicare Other | Attending: Family Medicine | Admitting: Registered"

## 2018-07-01 ENCOUNTER — Encounter: Payer: Self-pay | Admitting: Registered"

## 2018-07-01 DIAGNOSIS — R7303 Prediabetes: Secondary | ICD-10-CM | POA: Diagnosis not present

## 2018-07-01 NOTE — Progress Notes (Signed)
Patient was seen on 07/01/18 for the Diabetes Prevention Program course at [facility]. By the end of this session patients are able to complete the following objectives:   Learning Objectives:  List 9 ways to to stay on track during special events/occasions  Create a plan to help avoid a food problem at an upcoming special event/occasion  Describe ways to make time for healthy behaviors during special events/occasions   Goals:   Record weight taken outside of class.   Track foods and beverages eaten each day in the "Food and Activity Tracker," including calories and fat grams for each item.    Track activity type, minutes you were active, and distance you reached each day in the "Food and Activity Tracker."   Follow-Up Plan:  Attend next session.   Bring completed "Food and Activity Trackers" to next session to be reviewed by Lifestyle Coach.

## 2018-07-21 ENCOUNTER — Ambulatory Visit (INDEPENDENT_AMBULATORY_CARE_PROVIDER_SITE_OTHER): Payer: Medicare Other | Admitting: Neurology

## 2018-07-21 ENCOUNTER — Other Ambulatory Visit (INDEPENDENT_AMBULATORY_CARE_PROVIDER_SITE_OTHER): Payer: Medicare Other

## 2018-07-21 ENCOUNTER — Other Ambulatory Visit: Payer: Self-pay

## 2018-07-21 ENCOUNTER — Encounter: Payer: Self-pay | Admitting: Neurology

## 2018-07-21 VITALS — BP 118/72 | HR 74 | Ht 73.0 in | Wt 264.0 lb

## 2018-07-21 DIAGNOSIS — R413 Other amnesia: Secondary | ICD-10-CM | POA: Diagnosis not present

## 2018-07-21 DIAGNOSIS — G40109 Localization-related (focal) (partial) symptomatic epilepsy and epileptic syndromes with simple partial seizures, not intractable, without status epilepticus: Secondary | ICD-10-CM

## 2018-07-21 LAB — VITAMIN B12: VITAMIN B 12: 255 pg/mL (ref 211–911)

## 2018-07-21 NOTE — Patient Instructions (Addendum)
1. Bloodwork for B12 level  Your provider requests that you have LABS drawn today.  We share a lab with [facility] - they are located in [address] (second floor) of this building.  Once you get there, please have a seat and the phlebotomist will call your name.  If you have waited more than 15 minutes, please advise the front desk    2. Continue Zonisamide 100mg : Take 3 capsules every night 3. Follow-up in 6 months, call for any changes  Seizure Precautions: 1. If medication has been prescribed for you to prevent seizures, take it exactly as directed.  Do not stop taking the medicine without talking to your doctor first, even if you have not had a seizure in a long time.   2. Avoid activities in which a seizure would cause danger to yourself or to others.  Don't operate dangerous machinery, swim alone, or climb in high or dangerous places, such as on ladders, roofs, or girders.  Do not drive unless your doctor says you may.  3. If you have any warning that you may have a seizure, lay down in a safe place where you can't hurt yourself.    4.  No driving for 6 months from last seizure, as per [facility].   Please refer to the following link on the [facility] website for more information: http://www.epilepsyfoundation.org/answerplace/Social/driving/drivingu.cfm   5.  Maintain good sleep hygiene. Avoid alcohol.  6.  Contact your doctor if you have any problems that may be related to the medicine you are taking.  7.  Call 911 and bring the patient back to the ED if:        A.  The seizure lasts longer than 5 minutes.       B.  The patient doesn't awaken shortly after the seizure  C.  The patient has new problems such as difficulty seeing, speaking or moving  D.  The patient was injured during the seizure  E.  The patient has a temperature over 102 F (39C)  F.  The patient vomited and now is having trouble breathing
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
## 2018-07-22 ENCOUNTER — Telehealth: Payer: Self-pay

## 2018-07-22 NOTE — Telephone Encounter (Signed)
-----   Message from Cameron Sprang, MD sent at 07/22/2018  9:10 AM EST ----- Pls let him know his B12 level was 255. Typical range is between 211-911, but we had discussed that in patients with memory issues, we want the level to be above 400. Start daily vitamin B12 500 mcg supplement. Thanks

## 2018-07-22 NOTE — Telephone Encounter (Signed)
LMOM for pt's mother, Carlene, asking for return call to relay message below.

## 2018-07-25 ENCOUNTER — Telehealth: Payer: Self-pay | Admitting: Neurology

## 2018-07-25 NOTE — Telephone Encounter (Signed)
Patient's mother returning your call for results. Please call. Thanks

## 2018-07-25 NOTE — Telephone Encounter (Signed)
Returned call to pt's mother.  Relayed B12 results (255).  Advised mother to have pt start B12 daily supplement of 544mcg/day, per Dr. Delice Lesch (pls see previous phone encounter)

## 2018-08-05 ENCOUNTER — Encounter: Payer: Medicare Other | Attending: Family Medicine | Admitting: Registered"

## 2018-08-05 ENCOUNTER — Encounter: Payer: Self-pay | Admitting: Registered"

## 2018-08-05 DIAGNOSIS — R7303 Prediabetes: Secondary | ICD-10-CM

## 2018-08-05 NOTE — Progress Notes (Signed)
Patient was seen on 08/05/18 for the Diabetes Prevention Program course at [facility]. By the end of this session patients are able to complete the following objectives:   Learning Objectives:  Counter self-defeating thoughts with positive self-statements  Define assertiveness.   List examples of ways to practice assertiveness.   Goals:   Record weight taken outside of class.   Track foods and beverages eaten each day in the "Food and Activity Tracker," including calories and fat grams for each item.    Track activity type, minutes you were active, and distance you reached each day in the "Food and Activity Tracker."   Follow-Up Plan:  Attend next session.   Bring completed "Food and Activity Trackers" to next session to be reviewed by Lifestyle Coach.

## 2018-08-19 ENCOUNTER — Encounter: Payer: Self-pay | Admitting: Registered"

## 2018-08-19 ENCOUNTER — Encounter: Payer: Medicare Other | Admitting: Registered"

## 2018-08-19 DIAGNOSIS — R7303 Prediabetes: Secondary | ICD-10-CM

## 2018-08-19 NOTE — Progress Notes (Signed)
Patient was seen on 08/19/18 for the Diabetes Prevention Program course at [facility]. By the end of this final session patients are able to complete the following objectives:   Learning Objectives:  Reflect on lifestyle changes they have made since starting the program.   Set long-term goals to promote continued maintenance of lifestyle changes made during the program.   Goals:   Work toward reaching new long-term goals set during class.   Follow-Up Plan:  Contact Lifestyle Coach with questions/concerns PRN.

## 2018-08-22 ENCOUNTER — Encounter: Payer: Self-pay | Admitting: Internal Medicine

## 2018-08-22 ENCOUNTER — Ambulatory Visit (INDEPENDENT_AMBULATORY_CARE_PROVIDER_SITE_OTHER): Payer: Medicare Other | Admitting: Internal Medicine

## 2018-08-22 VITALS — BP 130/78 | HR 72 | Temp 98.9°F | Wt 266.0 lb

## 2018-08-22 DIAGNOSIS — B9789 Other viral agents as the cause of diseases classified elsewhere: Secondary | ICD-10-CM | POA: Diagnosis not present

## 2018-08-22 DIAGNOSIS — G4733 Obstructive sleep apnea (adult) (pediatric): Secondary | ICD-10-CM | POA: Diagnosis not present

## 2018-08-22 DIAGNOSIS — J069 Acute upper respiratory infection, unspecified: Secondary | ICD-10-CM
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
## 2018-08-22 NOTE — Patient Instructions (Signed)
  You exam is reassuring   Chest exam is clear.  saline for the nose   Can try mucinex or mucinex dm for the cough or just hot liquids and no meds.   I think this is a viral infection and should run its course.   If not improving after a week or get  high fever  Shaking chills short of breath or severe pain then plan ROV>

## 2018-08-29 ENCOUNTER — Telehealth: Payer: Self-pay | Admitting: Adult Health

## 2018-08-29 NOTE — Telephone Encounter (Signed)
Called and spoke with patient, he wanted to make an appointment to follow up on his CPAP machine. Appointment made. Nothing further needed.

## 2018-09-06 ENCOUNTER — Encounter: Payer: Self-pay | Admitting: Adult Health

## 2018-09-06 ENCOUNTER — Ambulatory Visit (INDEPENDENT_AMBULATORY_CARE_PROVIDER_SITE_OTHER): Payer: Medicare Other | Admitting: Adult Health

## 2018-09-06 DIAGNOSIS — G4733 Obstructive sleep apnea (adult) (pediatric): Secondary | ICD-10-CM

## 2018-09-06 DIAGNOSIS — E669 Obesity, unspecified: Secondary | ICD-10-CM | POA: Diagnosis not present

## 2018-09-06 NOTE — Assessment & Plan Note (Signed)
Wt loss  

## 2018-09-06 NOTE — Patient Instructions (Signed)
Keep up great job.  Continue on CPAP At bedtime   Order sent for supplies and mask.  Follow up with Dr. Elsworth Soho  In 1 year and As needed
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
## 2018-09-06 NOTE — Assessment & Plan Note (Signed)
Well controlled on CPAP   Plan  Patient Instructions  Keep up great job.  Continue on CPAP At bedtime   Order sent for supplies and mask.  Follow up with Dr. Elsworth Soho  In 1 year and As needed

## 2018-09-08 ENCOUNTER — Telehealth: Payer: Self-pay | Admitting: Adult Health

## 2018-09-08 NOTE — Addendum Note (Signed)
Addended by: Parke Poisson E on: 09/08/2018 09:26 AM   Modules accepted: Orders

## 2018-09-08 NOTE — Telephone Encounter (Signed)
Called and spoke to pt.  Pt states that [facility] has not reached out to him as of yet regarding cpap.  Per our records order was placed to [facility] today for new cpap machine.  I have advised pt to contact our office in one week if [facility] has not reached out to him. Pt voiced his understanding and had no additional questions. Nothing further is needed.

## 2018-09-09 DIAGNOSIS — L738 Other specified follicular disorders: Secondary | ICD-10-CM | POA: Diagnosis not present

## 2018-09-09 DIAGNOSIS — D229 Melanocytic nevi, unspecified: Secondary | ICD-10-CM | POA: Diagnosis not present

## 2018-09-09 DIAGNOSIS — L821 Other seborrheic keratosis: Secondary | ICD-10-CM | POA: Diagnosis not present

## 2018-09-09 DIAGNOSIS — D1801 Hemangioma of skin and subcutaneous tissue: Secondary | ICD-10-CM | POA: Diagnosis not present

## 2018-09-09 DIAGNOSIS — L218 Other seborrheic dermatitis: Secondary | ICD-10-CM | POA: Diagnosis not present

## 2018-09-09 DIAGNOSIS — L570 Actinic keratosis: Secondary | ICD-10-CM | POA: Diagnosis not present

## 2018-09-09 DIAGNOSIS — L814 Other melanin hyperpigmentation: Secondary | ICD-10-CM | POA: Diagnosis not present

## 2018-09-09 DIAGNOSIS — L819 Disorder of pigmentation, unspecified: Secondary | ICD-10-CM | POA: Diagnosis not present

## 2018-09-16 ENCOUNTER — Telehealth: Payer: Self-pay | Admitting: Adult Health

## 2018-09-16 NOTE — Telephone Encounter (Signed)
Patient returning phone call.  Patient phone number is [phone].

## 2018-09-16 NOTE — Telephone Encounter (Signed)
ATC pt, no answer. Left message for pt to call back.  

## 2018-09-16 NOTE — Telephone Encounter (Signed)
Called and spoke with Patient.  He stated that [facility] has not brought him his supplies and he needs a new mask.  Called and left message for [place], [facility], to cal back.

## 2018-09-20 NOTE — Telephone Encounter (Signed)
Called Jason with [facility]. Unable to reach. Left message to give us a call back. 

## 2018-09-22 NOTE — Telephone Encounter (Signed)
Called and spoke with Amy, [facility]. She was unsure what was needed for Patient to receive CPAP supplies.  Transferred to Caryl Pina, who stated they needed face to face, education check list, and SMN, faxed to [phone].  Patients account is currently in audit.  Face to face from 09/06/18 OV faxed.  Rodena Piety, can you help with checklist and SMN?

## 2018-09-22 NOTE — Telephone Encounter (Signed)
Sorry I keep closing the encounters. I just spoke with Lattie Haw at [facility] and stated that the only form that has been sent to [place] was from 11/2017. Tammy Parret had signed the form on 09/14/2018. The patient had not been seen since 2017 until he was seen 09/06/2018. The form was needed to complete the audit on his account. He is now compliant again. Lattie Haw stated she was sending a message to someone named Fara Olden that was working with the audit and for them to call the patient

## 2018-11-14 ENCOUNTER — Other Ambulatory Visit: Payer: Self-pay | Admitting: Neurology

## 2018-11-14 DIAGNOSIS — G40109 Localization-related (focal) (partial) symptomatic epilepsy and epileptic syndromes with simple partial seizures, not intractable, without status epilepticus: Secondary | ICD-10-CM

## 2019-03-01 ENCOUNTER — Ambulatory Visit: Payer: Medicare Other | Admitting: Neurology

## 2019-03-17 ENCOUNTER — Encounter: Payer: Self-pay | Admitting: Neurology

## 2019-03-20 ENCOUNTER — Other Ambulatory Visit: Payer: Self-pay | Admitting: Family Medicine

## 2019-03-30 ENCOUNTER — Telehealth: Payer: Self-pay | Admitting: Family Medicine

## 2019-03-30 NOTE — Telephone Encounter (Signed)
Patient' s mother Edmonia Caprio is calling to ask the advice of Dr. Sarajane Jews regarding the shingles shot. Should he get one for safety shot. Patients mother had shingles for 29 weeks. Please advise [phone]

## 2019-03-30 NOTE — Telephone Encounter (Signed)
Please advise 

## 2019-03-30 NOTE — Telephone Encounter (Signed)
Have them check with insurance. He is over 50 so he can get it but insurance may not cover

## 2019-03-30 NOTE — Telephone Encounter (Signed)
Spoke with patient. He is going to check with his insurance company.

## 2019-04-03 ENCOUNTER — Ambulatory Visit (INDEPENDENT_AMBULATORY_CARE_PROVIDER_SITE_OTHER): Payer: Medicare Other

## 2019-04-03 ENCOUNTER — Telehealth: Payer: Self-pay | Admitting: Neurology

## 2019-04-03 ENCOUNTER — Other Ambulatory Visit: Payer: Self-pay

## 2019-04-03 DIAGNOSIS — Z23 Encounter for immunization: Secondary | ICD-10-CM | POA: Diagnosis not present

## 2019-04-03 NOTE — Telephone Encounter (Signed)
Patient has VV tomorrow and said he was returning call to the nurse. Not sure which one of yall called him. Thanks!

## 2019-04-03 NOTE — Progress Notes (Signed)
Per orders of Dr. Sarajane Jews, injection of Shingrix given by Rebecca Eaton. Patient tolerated injection well.

## 2019-04-03 NOTE — Telephone Encounter (Signed)
Patient has virtual visit tomorrow at 230 pm. [name] LPN aware patient called back to office (was likely a reminder call he received regarding tomorrow's visit).

## 2019-04-04 ENCOUNTER — Telehealth (INDEPENDENT_AMBULATORY_CARE_PROVIDER_SITE_OTHER): Payer: Medicare Other | Admitting: Neurology

## 2019-04-04 ENCOUNTER — Other Ambulatory Visit: Payer: Self-pay

## 2019-04-04 ENCOUNTER — Encounter: Payer: Self-pay | Admitting: Neurology

## 2019-04-04 VITALS — Ht 74.0 in | Wt 270.0 lb

## 2019-04-04 DIAGNOSIS — K149 Disease of tongue, unspecified: Secondary | ICD-10-CM

## 2019-04-04 DIAGNOSIS — Z87820 Personal history of traumatic brain injury: Secondary | ICD-10-CM

## 2019-04-04 DIAGNOSIS — G40109 Localization-related (focal) (partial) symptomatic epilepsy and epileptic syndromes with simple partial seizures, not intractable, without status epilepticus: Secondary | ICD-10-CM | POA: Diagnosis not present
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
## 2019-04-13 ENCOUNTER — Telehealth: Payer: Self-pay

## 2019-04-13 ENCOUNTER — Other Ambulatory Visit: Payer: Self-pay

## 2019-04-13 DIAGNOSIS — K149 Disease of tongue, unspecified: Secondary | ICD-10-CM

## 2019-04-13 DIAGNOSIS — Z87820 Personal history of traumatic brain injury: Secondary | ICD-10-CM

## 2019-04-13 NOTE — Telephone Encounter (Signed)
Referral faxed to [facility] [phone]

## 2019-04-17 NOTE — Telephone Encounter (Addendum)
Following up on ENT referral to [facility] (8/4 telemedicine appt with Dr. Delice Lesch requesting referral as patient "continues to report worsened tongue movements from baseline and requests an evaluation at a tertiary care ENT").  Called [facility] [phone]) and they do not accept fax referrals. Hanley Seamen [facility] patient's information and they stated he saw Dr. Izora Gala ENT in [place] (with [facility]) last in August 2019. The receptionist on the phone checked with one of the MDs at [facility] and he said for the patient to go back to see Dr. Constance Holster (part of [facility]) as he is already established there and then if Dr. Constance Holster felt patient needed to be seen by another ENT specialist at [facility] he could refer patient.    Called Dr. Janeice Robinson office [phone] and they stated patient is considered a current patient and he can just call and make an appt (no referral needed).   MD - would you like patient to start with Dr. Constance Holster ENT he has seen in the past?

## 2019-04-18 NOTE — Telephone Encounter (Addendum)
Called and spoke to patient and informed him that [facility] wants him to see his local ENT (Dr. Constance Holster) first. Patient verbalized understanding and phone number given of Dr. Constance Holster [phone]) for him to call and make an appt. (I called that office yesterday and he is still considered a current patient as was seen one year ago.)  Fax sent to Dr. Janeice Robinson office [phone]) with office notes for patient. Cover note with message from Dr. Delice Lesch that this is regarding "abnormal tongue movements would Botox be an option".  Update 8/19 spoke to [place] (Dr. Janeice Robinson assistant) and she is able to access all patient's info in [place]. Made her aware of Dr. Amparo Bristol reason for patient wanting to be seen (above). She is going to call and schedule with patient now.

## 2019-04-18 NOTE — Telephone Encounter (Signed)
That is fine, can send my note to Dr. Constance Holster and on cover sheet pls write: "abnormal tongue movements, would Botox be an option?" Pls let patient know that [facility] wants him to see his local ENT first and for him to schedule appt. Thanks

## 2019-04-24 DIAGNOSIS — K149 Disease of tongue, unspecified: Secondary | ICD-10-CM | POA: Diagnosis not present

## 2019-04-24 DIAGNOSIS — R471 Dysarthria and anarthria: Secondary | ICD-10-CM | POA: Diagnosis not present

## 2019-06-18 DIAGNOSIS — Z23 Encounter for immunization: Secondary | ICD-10-CM | POA: Diagnosis not present

## 2019-08-03 ENCOUNTER — Other Ambulatory Visit: Payer: Self-pay

## 2019-08-04 ENCOUNTER — Ambulatory Visit (INDEPENDENT_AMBULATORY_CARE_PROVIDER_SITE_OTHER): Payer: Medicare Other

## 2019-08-04 ENCOUNTER — Ambulatory Visit: Payer: Medicare Other

## 2019-08-04 DIAGNOSIS — Z23 Encounter for immunization: Secondary | ICD-10-CM | POA: Diagnosis not present

## 2019-08-04 NOTE — Progress Notes (Signed)
Per orders of Dr. Sarajane Jews, injection of Shingrix 2nd dose given by Franco Collet. Patient tolerated injection well.

## 2019-08-18 ENCOUNTER — Other Ambulatory Visit: Payer: Self-pay | Admitting: Neurology

## 2019-08-18 DIAGNOSIS — G40109 Localization-related (focal) (partial) symptomatic epilepsy and epileptic syndromes with simple partial seizures, not intractable, without status epilepticus: Secondary | ICD-10-CM

## 2019-09-13 DIAGNOSIS — D225 Melanocytic nevi of trunk: Secondary | ICD-10-CM | POA: Diagnosis not present

## 2019-09-13 DIAGNOSIS — L853 Xerosis cutis: Secondary | ICD-10-CM | POA: Diagnosis not present

## 2019-09-13 DIAGNOSIS — L821 Other seborrheic keratosis: Secondary | ICD-10-CM | POA: Diagnosis not present

## 2019-09-13 DIAGNOSIS — D1801 Hemangioma of skin and subcutaneous tissue: Secondary | ICD-10-CM | POA: Diagnosis not present

## 2019-10-06 ENCOUNTER — Encounter: Payer: Self-pay | Admitting: Neurology

## 2019-10-06 ENCOUNTER — Other Ambulatory Visit: Payer: Self-pay

## 2019-10-06 ENCOUNTER — Telehealth (INDEPENDENT_AMBULATORY_CARE_PROVIDER_SITE_OTHER): Payer: Medicare Other | Admitting: Neurology

## 2019-10-06 DIAGNOSIS — G40109 Localization-related (focal) (partial) symptomatic epilepsy and epileptic syndromes with simple partial seizures, not intractable, without status epilepticus: Secondary | ICD-10-CM | POA: Diagnosis not present

## 2019-10-06 MED ORDER — ZONISAMIDE 100 MG PO CAPS: ORAL_CAPSULE | ORAL | 3 refills | Status: DC
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
## 2019-10-16 ENCOUNTER — Ambulatory Visit: Payer: Medicare Other | Admitting: Neurology

## 2019-11-14 ENCOUNTER — Telehealth: Payer: Self-pay | Admitting: Family Medicine

## 2019-11-14 NOTE — Telephone Encounter (Signed)
Pt is requesting his medication list. He needs it faxed to [facility] [phone] Pt had an appt with them and they are requesting it   Pt can be reached at [phone]

## 2019-11-15 NOTE — Telephone Encounter (Signed)
Medication list faxed as requested per patient.

## 2019-12-12 ENCOUNTER — Other Ambulatory Visit: Payer: Self-pay

## 2019-12-12 ENCOUNTER — Other Ambulatory Visit (INDEPENDENT_AMBULATORY_CARE_PROVIDER_SITE_OTHER): Payer: Medicare Other

## 2019-12-12 DIAGNOSIS — N138 Other obstructive and reflux uropathy: Secondary | ICD-10-CM | POA: Diagnosis not present

## 2019-12-12 DIAGNOSIS — E785 Hyperlipidemia, unspecified: Secondary | ICD-10-CM

## 2019-12-12 DIAGNOSIS — R7309 Other abnormal glucose: Secondary | ICD-10-CM | POA: Diagnosis not present

## 2019-12-12 DIAGNOSIS — N401 Enlarged prostate with lower urinary tract symptoms: Secondary | ICD-10-CM | POA: Diagnosis not present

## 2019-12-12 LAB — HEPATIC FUNCTION PANEL
ALT: 30 U/L (ref 0–53)
AST: 22 U/L (ref 0–37)
Albumin: 4.3 g/dL (ref 3.5–5.2)
Alkaline Phosphatase: 68 U/L (ref 39–117)
Bilirubin, Direct: 0.1 mg/dL (ref 0.0–0.3)
Total Bilirubin: 0.4 mg/dL (ref 0.2–1.2)
Total Protein: 6.1 g/dL (ref 6.0–8.3)

## 2019-12-12 LAB — BASIC METABOLIC PANEL
BUN: 15 mg/dL (ref 6–23)
CO2: 26 mEq/L (ref 19–32)
Calcium: 9.3 mg/dL (ref 8.4–10.5)
Chloride: 110 mEq/L (ref 96–112)
Creatinine, Ser: 1.27 mg/dL (ref 0.40–1.50)
GFR: 59.34 mL/min — ABNORMAL LOW (ref >60.00)
Glucose, Bld: 118 mg/dL — ABNORMAL HIGH (ref 70–99)
Potassium: 4.2 mEq/L (ref 3.5–5.1)
Sodium: 144 mEq/L (ref 135–145)

## 2019-12-12 LAB — CBC WITH DIFFERENTIAL/PLATELET
Basophils Absolute: 0 10*3/uL (ref 0.0–0.1)
Basophils Relative: 0.6 % (ref 0.0–3.0)
Eosinophils Absolute: 0.1 10*3/uL (ref 0.0–0.7)
Eosinophils Relative: 1.6 % (ref 0.0–5.0)
HCT: 40.1 % (ref 39.0–52.0)
Hemoglobin: 13.3 g/dL (ref 13.0–17.0)
Lymphocytes Relative: 34.1 % (ref 12.0–46.0)
Lymphs Abs: 1.9 10*3/uL (ref 0.7–4.0)
MCHC: 33.2 g/dL (ref 30.0–36.0)
MCV: 89.7 fl (ref 78.0–100.0)
Monocytes Absolute: 0.5 10*3/uL (ref 0.1–1.0)
Monocytes Relative: 8.5 % (ref 3.0–12.0)
Neutro Abs: 3 10*3/uL (ref 1.4–7.7)
Neutrophils Relative %: 55.2 % (ref 43.0–77.0)
Platelets: 230 10*3/uL (ref 150.0–400.0)
RBC: 4.48 Mil/uL (ref 4.22–5.81)
RDW: 13.8 % (ref 11.5–15.5)
WBC: 5.5 10*3/uL (ref 4.0–10.5)

## 2019-12-12 LAB — LIPID PANEL
Cholesterol: 112 mg/dL (ref 0–200)
HDL: 29.6 mg/dL — ABNORMAL LOW (ref >39.00)
LDL Cholesterol: 71 mg/dL (ref 0–99)
NonHDL: 82.67
Total CHOL/HDL Ratio: 4
Triglycerides: 56 mg/dL (ref 0.0–149.0)
VLDL: 11.2 mg/dL (ref 0.0–40.0)

## 2019-12-12 LAB — PSA: PSA: 1.06 ng/mL (ref 0.10–4.00)

## 2019-12-12 LAB — HEMOGLOBIN A1C: Hgb A1c MFr Bld: 5.7 % (ref 4.6–6.5)

## 2019-12-12 LAB — TSH: TSH: 2.4 u[IU]/mL (ref 0.35–4.50)

## 2019-12-14 ENCOUNTER — Other Ambulatory Visit: Payer: Self-pay

## 2019-12-14 ENCOUNTER — Ambulatory Visit (INDEPENDENT_AMBULATORY_CARE_PROVIDER_SITE_OTHER): Payer: Medicare Other | Admitting: Family Medicine

## 2019-12-14 ENCOUNTER — Encounter: Payer: Self-pay | Admitting: Family Medicine

## 2019-12-14 VITALS — BP 122/70 | HR 79 | Temp 97.1°F | Ht 73.0 in | Wt 277.8 lb

## 2019-12-14 DIAGNOSIS — I10 Essential (primary) hypertension: Secondary | ICD-10-CM

## 2019-12-14 DIAGNOSIS — N138 Other obstructive and reflux uropathy: Secondary | ICD-10-CM | POA: Diagnosis not present

## 2019-12-14 DIAGNOSIS — E785 Hyperlipidemia, unspecified: Secondary | ICD-10-CM | POA: Diagnosis not present

## 2019-12-14 DIAGNOSIS — N401 Enlarged prostate with lower urinary tract symptoms: Secondary | ICD-10-CM | POA: Diagnosis not present

## 2019-12-14 DIAGNOSIS — I350 Nonrheumatic aortic (valve) stenosis: Secondary | ICD-10-CM

## 2019-12-14 DIAGNOSIS — R7309 Other abnormal glucose: Secondary | ICD-10-CM

## 2019-12-14 DIAGNOSIS — S069X9D Unspecified intracranial injury with loss of consciousness of unspecified duration, subsequent encounter: Secondary | ICD-10-CM

## 2019-12-14 MED ORDER — SILDENAFIL CITRATE 100 MG PO TABS: 100.0000 mg | ORAL_TABLET | ORAL | 5 refills | Status: DC | PRN

## 2019-12-14 NOTE — Progress Notes (Signed)
Subjective:    Patient ID: Patrick Pearson, male    DOB: 14-Jan-1966, 54 y.o.   MRN: [id number]  HPI Here to follow up on issues. He feels well. His BP has been stable. He admits to not getting as much exercise as he used to due to the virus pandemic. He has received both Covid vaccines.    Review of Systems  Constitutional: Negative.   HENT: Negative.   Eyes: Negative.   Respiratory: Negative.   Cardiovascular: Negative.   Gastrointestinal: Negative.   Genitourinary: Negative.   Musculoskeletal: Negative.   Skin: Negative.   Neurological: Positive for speech difficulty and weakness.  Psychiatric/Behavioral: Negative.        Objective:   Physical Exam Constitutional:      General: He is not in acute distress.    Appearance: He is well-developed. He is not diaphoretic.     Comments: He walks with a cane   HENT:     Head: Normocephalic and atraumatic.     Right Ear: External ear normal.     Left Ear: External ear normal.     Nose: Nose normal.     Mouth/Throat:     Pharynx: No oropharyngeal exudate.  Eyes:     General: No scleral icterus.       Right eye: No discharge.        Left eye: No discharge.     Conjunctiva/sclera: Conjunctivae normal.     Pupils: Pupils are equal, round, and reactive to light.  Neck:     Thyroid: No thyromegaly.     Vascular: No JVD.     Trachea: No tracheal deviation.  Cardiovascular:     Rate and Rhythm: Normal rate and regular rhythm.     Heart sounds: Normal heart sounds. No murmur. No friction rub. No gallop.   Pulmonary:     Effort: Pulmonary effort is normal. No respiratory distress.     Breath sounds: Normal breath sounds. No wheezing or rales.  Chest:     Chest wall: No tenderness.  Abdominal:     General: Bowel sounds are normal. There is no distension.     Palpations: Abdomen is soft. There is no mass.     Tenderness: There is no abdominal tenderness. There is no guarding or rebound.  Genitourinary:    Penis: Normal. No  tenderness.      Prostate: Normal.     Rectum: Normal. Guaiac result negative.  Musculoskeletal:        General: No tenderness. Normal range of motion.     Cervical back: Neck supple.  Lymphadenopathy:     Cervical: No cervical adenopathy.  Skin:    General: Skin is warm and dry.     Coloration: Skin is not pale.     Findings: No erythema or rash.  Neurological:     Mental Status: He is alert and oriented to person, place, and time.     Cranial Nerves: No cranial nerve deficit.     Motor: No abnormal muscle tone.     Coordination: Coordination normal.     Deep Tendon Reflexes: Reflexes are normal and symmetric. Reflexes normal.  Psychiatric:        Behavior: Behavior normal.        Thought Content: Thought content normal.        Judgment: Judgment normal.           Assessment & Plan:  He is doing well in general. HTN is stable. His glucoses  are well controlled his cholesterol is well controlled. He has moderate aortic valve stenosis, as seen on an ECHO in 2016. This sounds to be unchanged on exam and he is asymptomatic. We will continue to monitor this.  Alysia Penna, MD

## 2020-01-31 ENCOUNTER — Telehealth: Payer: Self-pay | Admitting: Family Medicine

## 2020-01-31 NOTE — Progress Notes (Signed)
  Chronic Care Management   Outreach Note  01/31/2020 Name: Patrick Pearson MRN: [id number] DOB: 1966/03/23  Referred by: Laurey Morale, MD Reason for referral : No chief complaint on file.   An unsuccessful telephone outreach was attempted today. The patient was referred to the pharmacist for assistance with care management and care coordination.  This note is not being shared with the patient for the following reason: To respect privacy (The patient or proxy has requested that the information not be shared).   Follow Up Plan:   [place]

## 2020-02-01 ENCOUNTER — Telehealth: Payer: Self-pay | Admitting: Family Medicine

## 2020-02-01 NOTE — Progress Notes (Signed)
Patient has a prank recording when I have tried multiple times to schedule for a CCM visit.  Unable to schedule for CCM.  Prathima IT consultant

## 2020-02-14 ENCOUNTER — Other Ambulatory Visit: Payer: Self-pay | Admitting: Family Medicine

## 2020-03-07 ENCOUNTER — Ambulatory Visit (INDEPENDENT_AMBULATORY_CARE_PROVIDER_SITE_OTHER): Payer: Medicare Other | Admitting: Neurology

## 2020-03-07 ENCOUNTER — Encounter: Payer: Self-pay | Admitting: Neurology

## 2020-03-07 ENCOUNTER — Other Ambulatory Visit: Payer: Self-pay

## 2020-03-07 VITALS — BP 122/64 | HR 66 | Ht 73.0 in

## 2020-03-07 DIAGNOSIS — G40109 Localization-related (focal) (partial) symptomatic epilepsy and epileptic syndromes with simple partial seizures, not intractable, without status epilepticus: Secondary | ICD-10-CM
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
## 2020-07-09 DIAGNOSIS — Z23 Encounter for immunization: Secondary | ICD-10-CM | POA: Diagnosis not present

## 2020-07-30 DIAGNOSIS — Z23 Encounter for immunization: Secondary | ICD-10-CM | POA: Diagnosis not present

## 2020-08-09 ENCOUNTER — Telehealth: Payer: Self-pay | Admitting: Family Medicine

## 2020-08-09 NOTE — Telephone Encounter (Signed)
Left message for patient to call back and schedule Medicare Annual Wellness Visit (AWV) either virtually or in office.   Last AWV no information please schedule at anytime with [facility] Nurse Health Advisor 1 or 2   This should be a 45 minute visit. 

## 2020-08-26 ENCOUNTER — Other Ambulatory Visit: Payer: Self-pay | Admitting: Neurology

## 2020-08-26 DIAGNOSIS — G40109 Localization-related (focal) (partial) symptomatic epilepsy and epileptic syndromes with simple partial seizures, not intractable, without status epilepticus: Secondary | ICD-10-CM

## 2020-09-04 ENCOUNTER — Other Ambulatory Visit: Payer: Self-pay

## 2020-09-05 ENCOUNTER — Encounter: Payer: Self-pay | Admitting: Family Medicine

## 2020-09-05 ENCOUNTER — Ambulatory Visit (INDEPENDENT_AMBULATORY_CARE_PROVIDER_SITE_OTHER): Payer: Medicare Other | Admitting: Family Medicine

## 2020-09-05 VITALS — BP 104/64 | HR 51 | Temp 98.4°F | Ht 73.5 in | Wt 280.0 lb

## 2020-09-05 DIAGNOSIS — N401 Enlarged prostate with lower urinary tract symptoms: Secondary | ICD-10-CM | POA: Diagnosis not present

## 2020-09-05 DIAGNOSIS — N138 Other obstructive and reflux uropathy: Secondary | ICD-10-CM

## 2020-09-05 DIAGNOSIS — E785 Hyperlipidemia, unspecified: Secondary | ICD-10-CM | POA: Diagnosis not present

## 2020-09-05 DIAGNOSIS — G40109 Localization-related (focal) (partial) symptomatic epilepsy and epileptic syndromes with simple partial seizures, not intractable, without status epilepticus: Secondary | ICD-10-CM | POA: Diagnosis not present

## 2020-09-05 DIAGNOSIS — I350 Nonrheumatic aortic (valve) stenosis: Secondary | ICD-10-CM | POA: Diagnosis not present

## 2020-09-05 DIAGNOSIS — F528 Other sexual dysfunction not due to a substance or known physiological condition: Secondary | ICD-10-CM | POA: Diagnosis not present

## 2020-09-05 DIAGNOSIS — Z87820 Personal history of traumatic brain injury: Secondary | ICD-10-CM | POA: Diagnosis not present

## 2020-09-05 DIAGNOSIS — I10 Essential (primary) hypertension: Secondary | ICD-10-CM

## 2020-09-05 DIAGNOSIS — R7309 Other abnormal glucose: Secondary | ICD-10-CM | POA: Diagnosis not present

## 2020-09-05 LAB — LIPID PANEL
Cholesterol: 119 mg/dL (ref 0–200)
HDL: 29.8 mg/dL — ABNORMAL LOW (ref >39.00)
LDL Cholesterol: 66 mg/dL (ref 0–99)
NonHDL: 89.6
Total CHOL/HDL Ratio: 4
Triglycerides: 117 mg/dL (ref 0.0–149.0)
VLDL: 23.4 mg/dL (ref 0.0–40.0)

## 2020-09-05 LAB — TSH: TSH: 2.56 u[IU]/mL (ref 0.35–4.50)

## 2020-09-05 LAB — HEPATIC FUNCTION PANEL
ALT: 30 U/L (ref 0–53)
AST: 22 U/L (ref 0–37)
Albumin: 4.3 g/dL (ref 3.5–5.2)
Alkaline Phosphatase: 55 U/L (ref 39–117)
Bilirubin, Direct: 0.1 mg/dL (ref 0.0–0.3)
Total Bilirubin: 0.5 mg/dL (ref 0.2–1.2)
Total Protein: 6 g/dL (ref 6.0–8.3)

## 2020-09-05 LAB — BASIC METABOLIC PANEL
BUN: 18 mg/dL (ref 6–23)
CO2: 26 mEq/L (ref 19–32)
Calcium: 9 mg/dL (ref 8.4–10.5)
Chloride: 109 mEq/L (ref 96–112)
Creatinine, Ser: 1.28 mg/dL (ref 0.40–1.50)
GFR: 63.83 mL/min (ref >60.00)
Glucose, Bld: 107 mg/dL — ABNORMAL HIGH (ref 70–99)
Potassium: 4.2 mEq/L (ref 3.5–5.1)
Sodium: 142 mEq/L (ref 135–145)

## 2020-09-05 LAB — CBC WITH DIFFERENTIAL/PLATELET
Basophils Absolute: 0 10*3/uL (ref 0.0–0.1)
Basophils Relative: 0.7 % (ref 0.0–3.0)
Eosinophils Absolute: 0.1 10*3/uL (ref 0.0–0.7)
Eosinophils Relative: 1.6 % (ref 0.0–5.0)
HCT: 42 % (ref 39.0–52.0)
Hemoglobin: 14.1 g/dL (ref 13.0–17.0)
Lymphocytes Relative: 30.9 % (ref 12.0–46.0)
Lymphs Abs: 1.7 10*3/uL (ref 0.7–4.0)
MCHC: 33.5 g/dL (ref 30.0–36.0)
MCV: 89.7 fl (ref 78.0–100.0)
Monocytes Absolute: 0.4 10*3/uL (ref 0.1–1.0)
Monocytes Relative: 7.8 % (ref 3.0–12.0)
Neutro Abs: 3.3 10*3/uL (ref 1.4–7.7)
Neutrophils Relative %: 59 % (ref 43.0–77.0)
Platelets: 185 10*3/uL (ref 150.0–400.0)
RBC: 4.69 Mil/uL (ref 4.22–5.81)
RDW: 12.7 % (ref 11.5–15.5)
WBC: 5.6 10*3/uL (ref 4.0–10.5)

## 2020-09-05 LAB — PSA: PSA: 1.02 ng/mL (ref 0.10–4.00)

## 2020-09-05 LAB — HEMOGLOBIN A1C: Hgb A1c MFr Bld: 6.1 % (ref 4.6–6.5)

## 2020-09-05 MED ORDER — SILDENAFIL CITRATE 100 MG PO TABS
100.0000 mg | ORAL_TABLET | ORAL | 5 refills | Status: AC | PRN
Start: 2020-09-05 — End: ?

## 2020-09-05 NOTE — Progress Notes (Signed)
Subjective:    Patient ID: Patrick Pearson, male    DOB: 05-Mar-1966, 55 y.o.   MRN: [id number]  HPI Here to follow up on issues. He feels well and has no concerns. His BP is stable. His seizures are well controlled and he sees Dr. Karel Jarvis yearly. He is fasting for labs. His last ECHO was 5 years ago.    Review of Systems  Constitutional: Negative.   HENT: Negative.   Eyes: Negative.   Respiratory: Negative.   Cardiovascular: Negative.   Gastrointestinal: Negative.   Genitourinary: Negative.   Musculoskeletal: Positive for gait problem.  Skin: Negative.   Psychiatric/Behavioral: Negative.        Objective:   Physical Exam Constitutional:      General: He is not in acute distress.    Appearance: He is well-developed and well-nourished. He is not diaphoretic.     Comments: Walks with a cane   HENT:     Head: Normocephalic and atraumatic.     Right Ear: External ear normal.     Left Ear: External ear normal.     Nose: Nose normal.     Mouth/Throat:     Mouth: Oropharynx is clear and moist.     Pharynx: No oropharyngeal exudate.  Eyes:     General: No scleral icterus.       Right eye: No discharge.        Left eye: No discharge.     Extraocular Movements: EOM normal.     Conjunctiva/sclera: Conjunctivae normal.     Pupils: Pupils are equal, round, and reactive to light.  Neck:     Thyroid: No thyromegaly.     Vascular: No JVD.     Trachea: No tracheal deviation.  Cardiovascular:     Rate and Rhythm: Normal rate and regular rhythm.     Pulses: Intact distal pulses.     Heart sounds: Murmur heard.  No friction rub. No gallop.      Comments: Has a 3/6 SM  Pulmonary:     Effort: Pulmonary effort is normal. No respiratory distress.     Breath sounds: Normal breath sounds. No wheezing or rales.  Chest:     Chest wall: No tenderness.  Abdominal:     General: Bowel sounds are normal. There is no distension.     Palpations: Abdomen is soft. There is no mass.      Tenderness: There is no abdominal tenderness. There is no guarding or rebound.  Genitourinary:    Penis: Normal. No tenderness.      Testes: Normal.     Prostate: Normal.     Rectum: Normal. Guaiac result negative.  Musculoskeletal:        General: No tenderness or edema. Normal range of motion.     Cervical back: Neck supple.  Lymphadenopathy:     Cervical: No cervical adenopathy.  Skin:    General: Skin is warm and dry.     Coloration: Skin is not pale.     Findings: No erythema or rash.  Neurological:     Mental Status: He is alert and oriented to person, place, and time.     Cranial Nerves: No cranial nerve deficit.     Motor: No abnormal muscle tone.     Coordination: Coordination normal.     Deep Tendon Reflexes: Reflexes are normal and symmetric. Reflexes normal.  Psychiatric:        Mood and Affect: Mood and affect normal.  Behavior: Behavior normal.        Thought Content: Thought content normal.        Judgment: Judgment normal.           Assessment & Plan:  His HTN and seizures are stable. Get fasting labs to check lipids, A1c, etc. Set up another ECHO to follow up on aortic stenosis.  Alysia Penna, MD

## 2020-09-12 DIAGNOSIS — D229 Melanocytic nevi, unspecified: Secondary | ICD-10-CM | POA: Diagnosis not present

## 2020-09-12 DIAGNOSIS — L819 Disorder of pigmentation, unspecified: Secondary | ICD-10-CM | POA: Diagnosis not present

## 2020-09-12 DIAGNOSIS — L814 Other melanin hyperpigmentation: Secondary | ICD-10-CM | POA: Diagnosis not present

## 2020-09-12 DIAGNOSIS — L738 Other specified follicular disorders: Secondary | ICD-10-CM | POA: Diagnosis not present

## 2020-09-12 DIAGNOSIS — L718 Other rosacea: Secondary | ICD-10-CM | POA: Diagnosis not present

## 2020-09-26 ENCOUNTER — Other Ambulatory Visit: Payer: Self-pay

## 2020-09-26 ENCOUNTER — Ambulatory Visit (HOSPITAL_COMMUNITY): Payer: Medicare Other | Attending: Internal Medicine

## 2020-09-26 DIAGNOSIS — I350 Nonrheumatic aortic (valve) stenosis: Secondary | ICD-10-CM | POA: Insufficient documentation

## 2020-09-26 LAB — ECHOCARDIOGRAM COMPLETE
AR max vel: 1.28 cm2
AV Area VTI: 1.49 cm2
AV Area mean vel: 1.25 cm2
AV Mean grad: 26 mmHg
AV Peak grad: 45.7 mmHg
Ao pk vel: 3.38 m/s
Area-P 1/2: 3.42 cm2
S' Lateral: 3.8 cm

## 2020-10-02 ENCOUNTER — Telehealth: Payer: Self-pay | Admitting: Family Medicine

## 2020-10-02 NOTE — Telephone Encounter (Signed)
Pt was given his result. See echo result.

## 2020-10-02 NOTE — Telephone Encounter (Signed)
Patient called for results °

## 2020-10-29 ENCOUNTER — Other Ambulatory Visit: Payer: Self-pay | Admitting: Neurology

## 2020-10-29 DIAGNOSIS — G40109 Localization-related (focal) (partial) symptomatic epilepsy and epileptic syndromes with simple partial seizures, not intractable, without status epilepticus: Secondary | ICD-10-CM

## 2020-11-05 ENCOUNTER — Ambulatory Visit (INDEPENDENT_AMBULATORY_CARE_PROVIDER_SITE_OTHER): Payer: Medicare Other | Admitting: Neurology

## 2020-11-05 ENCOUNTER — Other Ambulatory Visit: Payer: Self-pay

## 2020-11-05 ENCOUNTER — Encounter: Payer: Self-pay | Admitting: Neurology

## 2020-11-05 VITALS — BP 119/70 | HR 63 | Ht 73.5 in | Wt 281.6 lb

## 2020-11-05 DIAGNOSIS — G40109 Localization-related (focal) (partial) symptomatic epilepsy and epileptic syndromes with simple partial seizures, not intractable, without status epilepticus: Secondary | ICD-10-CM
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
## 2020-11-05 NOTE — Patient Instructions (Signed)
Always good to see you. Call when you need refills. Continue to monitor balance, if falls start to occur, we will plan for balance therapy but also may consider having someone look at your knees. Follow-up in 8 months, call for any changes.  Seizure Precautions: 1. If medication has been prescribed for you to prevent seizures, take it exactly as directed.  Do not stop taking the medicine without talking to your doctor first, even if you have not had a seizure in a long time.   2. Avoid activities in which a seizure would cause danger to yourself or to others.  Don't operate dangerous machinery, swim alone, or climb in high or dangerous places, such as on ladders, roofs, or girders.  Do not drive unless your doctor says you may.  3. If you have any warning that you may have a seizure, lay down in a safe place where you can't hurt yourself.    4.  No driving for 6 months from last seizure, as per [facility].   Please refer to the following link on the [place] website for more information: http://www.epilepsyfoundation.org/answerplace/Social/driving/drivingu.cfm   5.  Maintain good sleep hygiene. Avoid alcohol.  6.  Contact your doctor if you have any problems that may be related to the medicine you are taking.  7.  Call 911 and bring the patient back to the ED if:        A.  The seizure lasts longer than 5 minutes.       B.  The patient doesn't awaken shortly after the seizure  C.  The patient has new problems such as difficulty seeing, speaking or moving  D.  The patient was injured during the seizure  E.  The patient has a temperature over 102 F (39C)  F.  The patient vomited and now is having trouble breathing

## 2020-11-11 ENCOUNTER — Telehealth: Payer: Self-pay | Admitting: Family Medicine

## 2020-11-11 NOTE — Telephone Encounter (Signed)
Left message for patient to call back and schedule Medicare Annual Wellness Visit (AWV) either virtually or in office. No detailed message left   AWVI  please schedule at anytime with [facility] Nurse Health Advisor 1 or 2   This should be a 45 minute visit. 

## 2020-11-20 ENCOUNTER — Other Ambulatory Visit: Payer: Self-pay | Admitting: Family Medicine

## 2020-11-26 NOTE — Progress Notes (Addendum)
Subjective:   Patrick Pearson is a 55 y.o. male who presents for an Initial Medicare Annual Wellness Visit.  I connected with Charlyne Quale today by telephone and verified that I am speaking with the correct person using two identifiers. Location patient: home Location provider: work Persons participating in the virtual visit: patient, provider.   I discussed the limitations, risks, security and privacy concerns of performing an evaluation and management service by telephone and the availability of in person appointments. I also discussed with the patient that there may be a patient responsible charge related to this service. The patient expressed understanding and verbally consented to this telephonic visit.    Interactive audio and video telecommunications were attempted between this provider and patient, however failed, due to patient having technical difficulties OR patient did not have access to video capability.  We continued and completed visit with audio only.      Review of Systems    N/A Cardiac Risk Factors include: male gender;hypertension;dyslipidemia     Objective:    There were no vitals filed for this visit. There is no height or weight on file to calculate BMI.  Advanced Directives 11/27/2020 11/05/2020 10/05/2019  Does Patient Have a Medical Advance Directive? Yes No No  Does patient want to make changes to medical advance directive? No - Patient declined - -    Current Medications (verified) Outpatient Encounter Medications as of 11/27/2020  Medication Sig  . acetaminophen (TYLENOL) 500 MG tablet Take 1,000 mg by mouth every 6 (six) hours as needed.  . cetirizine (ZYRTEC) 10 MG tablet Take 10 mg daily as needed by mouth.   Marland Kitchen ibuprofen (ADVIL,MOTRIN) 200 MG tablet Take 400 mg by mouth every 6 (six) hours as needed for moderate pain.  Marland Kitchen lisinopril-hydrochlorothiazide (ZESTORETIC) 20-25 MG tablet TAKE 1 TABLET EVERY DAY  . rosuvastatin (CRESTOR) 40 MG tablet TAKE 1  TABLET EVERY DAY  . sildenafil (VIAGRA) 100 MG tablet Take 1 tablet (100 mg total) by mouth as needed for erectile dysfunction.  . vitamin B-12 (CYANOCOBALAMIN) 100 MCG tablet Take 100 mcg by mouth daily.  . vitamin C (ASCORBIC ACID) 500 MG tablet Take 500 mg by mouth daily.  Marland Kitchen zonisamide (ZONEGRAN) 100 MG capsule TAKE 3 CAPSULES EVERY NIGHT   No facility-administered encounter medications on file as of 11/27/2020.    Allergies (verified) Patient has no known allergies.   History: Past Medical History:  Diagnosis Date  . Allergy   . Aortic stenosis 11/20/2011  . DEPRESSION 04/15/2007   Qualifier: Diagnosis of  By: Sarajane Jews MD, [place], HX OF 04/15/2007   Qualifier: Diagnosis of  By: Sarajane Jews MD, Ishmael Holter DOE (dyspnea on exertion) 11/03/2011   Echo 10/2011:  Normal LV, bicuspid aortic valve, normal RV PFT"s 10/2011:  Totally normal    . Elevated BP 05/18/2014   controlled with med  . ERECTILE DYSFUNCTION 04/02/2008   Qualifier: Diagnosis of  By: Sarajane Jews MD, Ishmael Holter   . HEAD TRAUMA, CLOSED 04/02/2008   Qualifier: Diagnosis of  By: Sarajane Jews MD, Ishmael Holter   . HYPERGLYCEMIA 09/29/2010   Qualifier: Diagnosis of  By: Sarajane Jews MD, Ishmael Holter   . HYPERLIPIDEMIA 07/11/2007   Qualifier: Diagnosis of  By: Sherlynn Stalls, CMA, [place]    . Hypertension   . Migraines    last one over 2 years ago  . MYALGIA 11/19/2009   Qualifier: Diagnosis of  By: Sarajane Jews MD, Ishmael Holter   . OSA (obstructive  sleep apnea) 11/03/2011   NPSG 2007:  AHI 54/hr, desat to 58% On CPAP   . OTHER SPEECH DISTURBANCE 09/30/2007   Qualifier: Diagnosis of  By: Sherlynn Stalls, [place], [place]    . Seizures ([place])    last seizure over 2 years ago - controlled with med  . Sleep apnea    uses CPAP nightly  . SYNCOPE 09/29/2010   Qualifier: Diagnosis of  By: Sarajane Jews MD, Ishmael Holter   . 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 UNS ADVRS EFF UNS RX MEDICINAL&BIOLOGICAL SBSTNC 01/15/2010   Qualifier: Diagnosis of  By: Joyce Gross     Past Surgical  History:  Procedure Laterality Date  . ANKLE SURGERY     left  . COLONOSCOPY  07/28/2017   per Dr. Carlean Purl, clear, repeat in 10 yrs   . VASECTOMY    . WISDOM TOOTH EXTRACTION     Family History  Problem Relation Age of Onset  . Breast cancer Mother   . Colon polyps Sister   . Alcohol abuse Other   . Arthritis Other   . Hyperlipidemia Other   . Mental illness Other   . Colon cancer Neg Hx   . Rectal cancer Neg Hx   . Stomach cancer Neg Hx    Social History   Socioeconomic History  . Marital status: Single    Spouse name: Not on file  . Number of children: 0  . Years of education: Not on file  . Highest education level: Not on file  Occupational History  . Occupation: disabled.     Employer: UNEMPLOYED  Tobacco Use  . Smoking status: Former Smoker    Packs/day: 1.00    Years: 20.00    Pack years: 20.00    Types: Cigarettes    Quit date: 09/01/1999    Years since quitting: 21.2  . Smokeless tobacco: Never Used  Vaping Use  . Vaping Use: Never used  Substance and Sexual Activity  . Alcohol use: Yes    Alcohol/week: 4.0 standard drinks    Types: 4 Cans of beer per week  . Drug use: No  . Sexual activity: Not on file  Other Topics Concern  . Not on file  Social History Narrative   Lives alone.   Left handed       Social Determinants of Health   Financial Resource Strain: Low Risk   . Difficulty of Paying Living Expenses: Not hard at all  Food Insecurity: No Food Insecurity  . Worried About Charity fundraiser in the Last Year: Never true  . Ran Out of Food in the Last Year: Never true  Transportation Needs: No Transportation Needs  . Lack of Transportation (Medical): No  . Lack of Transportation (Non-Medical): No  Physical Activity: Sufficiently Active  . Days of Exercise per Week: 4 days  . Minutes of Exercise per Session: 60 min  Stress: Not on file  Social Connections: Moderately Isolated  . Frequency of Communication with Friends and Family: More  than three times a week  . Frequency of Social Gatherings with Friends and Family: More than three times a week  . Attends Religious Services: Never  . Active Member of Clubs or Organizations: Yes  . Attends Archivist Meetings: 1 to 4 times per year  . Marital Status: Never married    Tobacco Counseling Counseling given: Not Answered   Clinical Intake:  Pre-visit preparation completed: Yes  Pain : No/denies  pain     Nutritional Risks: None Diabetes: No  How often do you need to have someone help you when you read instructions, pamphlets, or other written materials from your doctor or pharmacy?: 1 - Never What is the last grade level you completed in school?: high school  Diabetic?NO  Interpreter Needed?: No  Information entered by :: L.Kecia Swoboda,LPN   Activities of Daily Living In your present state of health, do you have any difficulty performing the following activities: 11/27/2020  Hearing? N  Vision? N  Difficulty concentrating or making decisions? N  Walking or climbing stairs? Y  Comment uses cane  Dressing or bathing? N  Doing errands, shopping? N  Preparing Food and eating ? N  Using the Toilet? N  In the past six months, have you accidently leaked urine? N  Do you have problems with loss of bowel control? N  Managing your Medications? N  Managing your Finances? N  Housekeeping or managing your Housekeeping? N  Some recent data might be hidden    Patient Care Team: Laurey Morale, MD as PCP - General Delice Lesch Lezlie Octave, MD as Consulting Physician (Neurology)  Indicate any recent Medical Services you may have received from other than [facility] providers in the past year (date may be approximate).      Assessment:   This is a routine wellness examination for [facility].  Hearing/Vision screen  Hearing Screening   125Hz  250Hz  500Hz  1000Hz  2000Hz  3000Hz  4000Hz  6000Hz  8000Hz   Right ear:           Left ear:           Vision Screening Comments: Pt seen as  needed pt does not wear glasses  Dietary issues and exercise activities discussed: Current Exercise Habits: Home exercise routine, Type of exercise: Other - see comments (riding a bike), Time (Minutes): 60, Frequency (Times/Week): 4, Weekly Exercise (Minutes/Week): 240, Intensity: Moderate, Exercise limited by: neurologic condition(s)  Goals    . Develop a Weight Loss Readiness Plan     Lose 25lbs by riding bike eating health food choices          Depression Screen PHQ 2/9 Scores 11/27/2020 11/27/2020 11/27/2020 09/05/2020  PHQ - 2 Score 0 0 0 0    Fall Risk Fall Risk  11/27/2020 11/05/2020 09/05/2020 10/05/2019 04/04/2019  Falls in the past year? 0 1 1 0 0  Comment - - - - -  Number falls in past yr: 0 1 0 0 0  Injury with Fall? 0 0 0 0 0  Risk for fall due to : Impaired balance/gait - - - -  Follow up Falls evaluation completed - - - Falls evaluation completed    FALL RISK PREVENTION PERTAINING TO THE HOME:  Any stairs in or around the home? Yes  If so, are there any without handrails? Yes  Home free of loose throw rugs in walkways, pet beds, electrical cords, etc? Yes  Adequate lighting in your home to reduce risk of falls? Yes   ASSISTIVE DEVICES UTILIZED TO PREVENT FALLS:  Life alert? No  Use of a cane, walker or w/c? Yes  Grab bars in the bathroom? No  Shower chair or bench in shower? Yes  Elevated toilet seat or a handicapped toilet? Yes   Cognitive Function:     Normal cognitive status assessed by direct observation by this Nurse Health Advisor. No abnormalities found.      Immunizations Immunization History  Administered Date(s) Administered  . H1N1 08/06/2008  .  Influenza Split 05/09/2012, 06/09/2013  . Influenza Whole 07/31/2005, 07/11/2007, 06/12/2008, 05/27/2010  . Influenza,inj,Quad PF,6+ Mos 05/18/2014, 06/07/2015, 05/27/2017, 05/30/2018  . Td 03/07/2009  . Tdap 05/27/2012  . Zoster Recombinat (Shingrix) 04/03/2019, 08/04/2019     TDAP status: Up to  date  Flu Vaccine status: Up to date  Pneumococcal vaccine status: Up to date  Covid-19 vaccine status: Completed vaccines  Qualifies for Shingles Vaccine? No   Zostavax completed No   Shingrix Completed?: No.    Education has been provided regarding the importance of this vaccine. Patient has been advised to call insurance company to determine out of pocket expense if they have not yet received this vaccine. Advised may also receive vaccine at local pharmacy or Health Dept. Verbalized acceptance and understanding.  Screening Tests Health Maintenance  Topic Date Due  . Hepatitis C Screening  Never done  . PNEUMOCOCCAL POLYSACCHARIDE VACCINE AGE 55-64 HIGH RISK  Never done  . COVID-19 Vaccine (1) Never done  . FOOT EXAM  Never done  . OPHTHALMOLOGY EXAM  Never done  . HIV Screening  Never done  . HEMOGLOBIN A1C  03/05/2021  . TETANUS/TDAP  05/27/2022  . COLONOSCOPY (Pts 45-64yrs Insurance coverage will need to be confirmed)  07/29/2027  . INFLUENZA VACCINE  Completed  . HPV VACCINES  Aged Out    Health Maintenance  Health Maintenance Due  Topic Date Due  . Hepatitis C Screening  Never done  . PNEUMOCOCCAL POLYSACCHARIDE VACCINE AGE 55-64 HIGH RISK  Never done  . COVID-19 Vaccine (1) Never done  . FOOT EXAM  Never done  . OPHTHALMOLOGY EXAM  Never done  . HIV Screening  Never done    Colorectal cancer screening: Type of screening: Colonoscopy. Completed 07/28/2017. Repeat every 10 years  Lung Cancer Screening: (Low Dose CT Chest recommended if Age 66-80 years, 30 pack-year currently smoking OR have quit w/in 15years.) does not qualify.   Lung Cancer Screening Referral: N/A  Additional Screening:  Hepatitis C Screening: does not qualify  Vision Screening: Recommended annual ophthalmology exams for early detection of glaucoma and other disorders of the eye. Is the patient up to date with their annual eye exam?  Yes  Who is the provider or what is the name of the office  in which the patient attends annual eye exams? [facility] opthalmology  If pt is not established with a provider, would they like to be referred to a provider to establish care? Yes .   Dental Screening: Recommended annual dental exams for proper oral hygiene  Community Resource Referral / Chronic Care Management: CRR required this visit?  No   CCM required this visit?  No      Plan:     I have personally reviewed and noted the following in the patient's chart:   . Medical and social history . Use of alcohol, tobacco or illicit drugs  . Current medications and supplements . Functional ability and status . Nutritional status . Physical activity . Advanced directives . List of other physicians . Hospitalizations, surgeries, and ER visits in previous 12 months . Vitals . Screenings to include cognitive, depression, and falls . Referrals and appointments  In addition, I have reviewed and discussed with patient certain preventive protocols, quality metrics, and best practice recommendations. A written personalized care plan for preventive services as well as general preventive health recommendations were provided to patient.     Randel Pigg, LPN   [id number]   Nurse Notes: None

## 2020-11-27 ENCOUNTER — Ambulatory Visit (INDEPENDENT_AMBULATORY_CARE_PROVIDER_SITE_OTHER): Payer: Medicare Other

## 2020-11-27 ENCOUNTER — Other Ambulatory Visit: Payer: Self-pay

## 2020-11-27 VITALS — BP 142/72 | HR 70 | Temp 98.7°F | Wt 278.0 lb

## 2020-11-27 DIAGNOSIS — Z0100 Encounter for examination of eyes and vision without abnormal findings: Secondary | ICD-10-CM | POA: Diagnosis not present

## 2020-11-27 DIAGNOSIS — Z0000 Encounter for general adult medical examination without abnormal findings: Secondary | ICD-10-CM

## 2020-11-27 NOTE — Patient Instructions (Signed)
Mr. Patrick Pearson , Thank you for taking time to come for your Medicare Wellness Visit. I appreciate your ongoing commitment to your health goals. Please review the following plan we discussed and let me know if I can assist you in the future.   Screening recommendations/referrals: Colonoscopy:due in 07/29/2027 Recommended yearly ophthalmology/optometry visit for glaucoma screening and checkup Recommended yearly dental visit for hygiene and checkup  Vaccinations: Influenza vaccine: completed- due fall 2022 Pneumococcal vaccine: completed  Tdap vaccine: due 2023  With injury  Shingles vaccine: completed  Series   Advanced directives: will provided a copy  Conditions/risks identified: none  Next appointment: None   Preventive Care 40-64 Years, Male Preventive care refers to lifestyle choices and visits with your health care provider that can promote health and wellness. What does preventive care include?  A yearly physical exam. This is also called an annual well check.  Dental exams once or twice a year.  Routine eye exams. Ask your health care provider how often you should have your eyes checked.  Personal lifestyle choices, including:  Daily care of your teeth and gums.  Regular physical activity.  Eating a healthy diet.  Avoiding tobacco and drug use.  Limiting alcohol use.  Practicing safe sex.  Taking low-dose aspirin every day starting at age 33. What happens during an annual well check? The services and screenings done by your health care provider during your annual well check will depend on your age, overall health, lifestyle risk factors, and family history of disease. Counseling  Your health care provider may ask you questions about your:  Alcohol use.  Tobacco use.  Drug use.  Emotional well-being.  Home and relationship well-being.  Sexual activity.  Eating habits.  Work and work Statistician. Screening  You may have the following tests or  measurements:  Height, weight, and BMI.  Blood pressure.  Lipid and cholesterol levels. These may be checked every 5 years, or more frequently if you are over 20 years old.  Skin check.  Lung cancer screening. You may have this screening every year starting at age 65 if you have a 30-pack-year history of smoking and currently smoke or have quit within the past 15 years.  Fecal occult blood test (FOBT) of the stool. You may have this test every year starting at age 28.  Flexible sigmoidoscopy or colonoscopy. You may have a sigmoidoscopy every 5 years or a colonoscopy every 10 years starting at age 4.  Prostate cancer screening. Recommendations will vary depending on your family history and other risks.  Hepatitis C blood test.  Hepatitis B blood test.  Sexually transmitted disease (STD) testing.  Diabetes screening. This is done by checking your blood sugar (glucose) after you have not eaten for a while (fasting). You may have this done every 1-3 years. Discuss your test results, treatment options, and if necessary, the need for more tests with your health care provider. Vaccines  Your health care provider may recommend certain vaccines, such as:  Influenza vaccine. This is recommended every year.  Tetanus, diphtheria, and acellular pertussis (Tdap, Td) vaccine. You may need a Td booster every 10 years.  Zoster vaccine. You may need this after age 67.  Pneumococcal 13-valent conjugate (PCV13) vaccine. You may need this if you have certain conditions and have not been vaccinated.  Pneumococcal polysaccharide (PPSV23) vaccine. You may need one or two doses if you smoke cigarettes or if you have certain conditions. Talk to your health care provider about which screenings and  vaccines you need and how often you need them. This information is not intended to replace advice given to you by your health care provider. Make sure you discuss any questions you have with your health care  provider. Document Released: 09/13/2015 Document Revised: 05/06/2016 Document Reviewed: 06/18/2015 Elsevier Interactive Patient Education  2017 [employer] Prevention in the Home Falls can cause injuries. They can happen to people of all ages. There are many things you can do to make your home safe and to help prevent falls. What can I do on the outside of my home?  Regularly fix the edges of walkways and driveways and fix any cracks.  Remove anything that might make you trip as you walk through a door, such as a raised step or threshold.  Trim any bushes or trees on the path to your home.  Use bright outdoor lighting.  Clear any walking paths of anything that might make someone trip, such as rocks or tools.  Regularly check to see if handrails are loose or broken. Make sure that both sides of any steps have handrails.  Any raised decks and porches should have guardrails on the edges.  Have any leaves, snow, or ice cleared regularly.  Use sand or salt on walking paths during winter.  Clean up any spills in your garage right away. This includes oil or grease spills. What can I do in the bathroom?  Use night lights.  Install grab bars by the toilet and in the tub and shower. Do not use towel bars as grab bars.  Use non-skid mats or decals in the tub or shower.  If you need to sit down in the shower, use a plastic, non-slip stool.  Keep the floor dry. Clean up any water that spills on the floor as soon as it happens.  Remove soap buildup in the tub or shower regularly.  Attach bath mats securely with double-sided non-slip rug tape.  Do not have throw rugs and other things on the floor that can make you trip. What can I do in the bedroom?  Use night lights.  Make sure that you have a light by your bed that is easy to reach.  Do not use any sheets or blankets that are too big for your bed. They should not hang down onto the floor.  Have a firm chair that has  side arms. You can use this for support while you get dressed.  Do not have throw rugs and other things on the floor that can make you trip. What can I do in the kitchen?  Clean up any spills right away.  Avoid walking on wet floors.  Keep items that you use a lot in easy-to-reach places.  If you need to reach something above you, use a strong step stool that has a grab bar.  Keep electrical cords out of the way.  Do not use floor polish or wax that makes floors slippery. If you must use wax, use non-skid floor wax.  Do not have throw rugs and other things on the floor that can make you trip. What can I do with my stairs?  Do not leave any items on the stairs.  Make sure that there are handrails on both sides of the stairs and use them. Fix handrails that are broken or loose. Make sure that handrails are as long as the stairways.  Check any carpeting to make sure that it is firmly attached to the stairs. Fix any  carpet that is loose or worn.  Avoid having throw rugs at the top or bottom of the stairs. If you do have throw rugs, attach them to the floor with carpet tape.  Make sure that you have a light switch at the top of the stairs and the bottom of the stairs. If you do not have them, ask someone to add them for you. What else can I do to help prevent falls?  Wear shoes that:  Do not have high heels.  Have rubber bottoms.  Are comfortable and fit you well.  Are closed at the toe. Do not wear sandals.  If you use a stepladder:  Make sure that it is fully opened. Do not climb a closed stepladder.  Make sure that both sides of the stepladder are locked into place.  Ask someone to hold it for you, if possible.  Clearly mark and make sure that you can see:  Any grab bars or handrails.  First and last steps.  Where the edge of each step is.  Use tools that help you move around (mobility aids) if they are needed. These  include:  Canes.  Walkers.  Scooters.  Crutches.  Turn on the lights when you go into a dark area. Replace any light bulbs as soon as they burn out.  Set up your furniture so you have a clear path. Avoid moving your furniture around.  If any of your floors are uneven, fix them.  If there are any pets around you, be aware of where they are.  Review your medicines with your doctor. Some medicines can make you feel dizzy. This can increase your chance of falling. Ask your doctor what other things that you can do to help prevent falls. This information is not intended to replace advice given to you by your health care provider. Make sure you discuss any questions you have with your health care provider. Document Released: 06/13/2009 Document Revised: 01/23/2016 Document Reviewed: 09/21/2014 Elsevier Interactive Patient Education  2017 [employer].

## 2020-11-29 ENCOUNTER — Telehealth: Payer: Self-pay | Admitting: Family Medicine

## 2020-11-29 NOTE — Telephone Encounter (Signed)
Patient mom is calling and wanted to see if patients lab results be mailed to the address we have on file, please advise. CB is [phone]

## 2020-11-29 NOTE — Telephone Encounter (Signed)
Pt lab results have been mailed out to pt address on file as requested

## 2020-12-17 ENCOUNTER — Other Ambulatory Visit: Payer: Self-pay | Admitting: Family Medicine
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
## 2021-01-09 ENCOUNTER — Encounter: Payer: Self-pay | Admitting: Cardiology

## 2021-01-09 ENCOUNTER — Ambulatory Visit (INDEPENDENT_AMBULATORY_CARE_PROVIDER_SITE_OTHER): Payer: Medicare Other | Admitting: Cardiology

## 2021-01-09 ENCOUNTER — Other Ambulatory Visit: Payer: Self-pay

## 2021-01-09 VITALS — BP 114/70 | HR 52 | Ht 73.0 in | Wt 280.0 lb

## 2021-01-09 DIAGNOSIS — I350 Nonrheumatic aortic (valve) stenosis: Secondary | ICD-10-CM

## 2021-01-09 NOTE — Patient Instructions (Signed)

## 2021-02-04 DIAGNOSIS — Z23 Encounter for immunization: Secondary | ICD-10-CM | POA: Diagnosis not present

## 2021-04-28 DIAGNOSIS — U071 COVID-19: Secondary | ICD-10-CM | POA: Diagnosis not present

## 2021-05-22 DIAGNOSIS — Z23 Encounter for immunization: Secondary | ICD-10-CM | POA: Diagnosis not present

## 2021-05-28 DIAGNOSIS — Z23 Encounter for immunization: Secondary | ICD-10-CM | POA: Diagnosis not present

## 2021-06-23 ENCOUNTER — Telehealth: Payer: Self-pay | Admitting: Neurology

## 2021-06-23 ENCOUNTER — Other Ambulatory Visit: Payer: Self-pay

## 2021-06-23 DIAGNOSIS — G40109 Localization-related (focal) (partial) symptomatic epilepsy and epileptic syndromes with simple partial seizures, not intractable, without status epilepticus: Secondary | ICD-10-CM

## 2021-06-23 MED ORDER — ZONISAMIDE 100 MG PO CAPS: ORAL_CAPSULE | ORAL | 0 refills | Status: DC

## 2021-06-23 NOTE — Addendum Note (Signed)
Addended by: Armen Pickup A on: 06/23/2021 11:29 AM   Modules accepted: Orders

## 2021-06-23 NOTE — Telephone Encounter (Signed)
Ok to fill with both pharmacies, 90-day for [facility] and 30-day for [facility], thanks

## 2021-06-23 NOTE — Telephone Encounter (Signed)
Called patient and informed him that we have sent his rx to [facility] for 30 days and also a 90 day to his Mail in pharmacy. Patient thanked me for the call and had no further questions or concerns.

## 2021-06-23 NOTE — Telephone Encounter (Signed)
Patient called and said he is almost out of his zonisamide 100 MG.  [place] normally but for now:  [facility] on [address]

## 2021-07-15 ENCOUNTER — Ambulatory Visit (INDEPENDENT_AMBULATORY_CARE_PROVIDER_SITE_OTHER): Payer: Medicare Other | Admitting: Neurology

## 2021-07-15 ENCOUNTER — Other Ambulatory Visit: Payer: Self-pay

## 2021-07-15 ENCOUNTER — Encounter: Payer: Self-pay | Admitting: Neurology

## 2021-07-15 DIAGNOSIS — G40109 Localization-related (focal) (partial) symptomatic epilepsy and epileptic syndromes with simple partial seizures, not intractable, without status epilepticus: Secondary | ICD-10-CM | POA: Diagnosis not present

## 2021-07-15 MED ORDER — ZONISAMIDE 100 MG PO CAPS: ORAL_CAPSULE | ORAL | 3 refills | Status: AC
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
## 2021-09-03 ENCOUNTER — Ambulatory Visit (INDEPENDENT_AMBULATORY_CARE_PROVIDER_SITE_OTHER): Payer: Medicare Other | Admitting: Family Medicine

## 2021-09-03 ENCOUNTER — Encounter: Payer: Self-pay | Admitting: Family Medicine

## 2021-09-03 VITALS — BP 110/70 | HR 54 | Temp 99.1°F | Wt 263.0 lb

## 2021-09-03 DIAGNOSIS — L03012 Cellulitis of left finger: Secondary | ICD-10-CM

## 2021-09-03 MED ORDER — CEPHALEXIN 500 MG PO CAPS: 500.0000 mg | ORAL_CAPSULE | Freq: Three times a day (TID) | ORAL | 0 refills | Status: AC

## 2021-09-03 NOTE — Progress Notes (Signed)
° °  Subjective:    Patient ID: Patrick Pearson, male    DOB: 12/25/1965, 56 y.o.   MRN: [id number]  HPI Here for 4 days of swelling on the left 4th finger. No recent trauma.    Review of Systems  Constitutional: Negative.   Respiratory: Negative.    Cardiovascular: Negative.   Skin:  Positive for wound.      Objective:   Physical Exam Constitutional:      Appearance: Normal appearance.  Cardiovascular:     Rate and Rhythm: Normal rate and regular rhythm.     Pulses: Normal pulses.     Heart sounds: Normal heart sounds.  Pulmonary:     Effort: Pulmonary effort is normal.     Breath sounds: Normal breath sounds.  Skin:    Comments: The area around the left 4th fingernail is red and tender, and there is a small pustule present   Neurological:     Mental Status: He is alert.          Assessment & Plan:  Paronychia. The pustule was lanced with a sterile needle and the purulent material was expressed. The site was dressed with Triple Antibiotic ointment and a Bandaid. Given 10 days of Keflex. Alysia Penna, MD

## 2021-09-17 DIAGNOSIS — L814 Other melanin hyperpigmentation: Secondary | ICD-10-CM | POA: Diagnosis not present

## 2021-09-17 DIAGNOSIS — D225 Melanocytic nevi of trunk: Secondary | ICD-10-CM | POA: Diagnosis not present

## 2021-09-17 DIAGNOSIS — L821 Other seborrheic keratosis: Secondary | ICD-10-CM | POA: Diagnosis not present

## 2021-11-27 ENCOUNTER — Other Ambulatory Visit: Payer: Self-pay | Admitting: Family Medicine

## 2021-12-01 ENCOUNTER — Telehealth: Payer: Self-pay | Admitting: Family Medicine

## 2021-12-01 ENCOUNTER — Ambulatory Visit (INDEPENDENT_AMBULATORY_CARE_PROVIDER_SITE_OTHER): Payer: Medicare Other

## 2021-12-01 VITALS — BP 124/64 | HR 68 | Temp 98.1°F | Ht 73.0 in | Wt 261.8 lb

## 2021-12-01 DIAGNOSIS — R7309 Other abnormal glucose: Secondary | ICD-10-CM

## 2021-12-01 DIAGNOSIS — N138 Other obstructive and reflux uropathy: Secondary | ICD-10-CM

## 2021-12-01 DIAGNOSIS — I10 Essential (primary) hypertension: Secondary | ICD-10-CM

## 2021-12-01 DIAGNOSIS — Z0000 Encounter for general adult medical examination without abnormal findings: Secondary | ICD-10-CM | POA: Diagnosis not present

## 2021-12-01 NOTE — Telephone Encounter (Signed)
Patient wants CPE labs before seeing Dr.Fry on 04/13. Will need callback to schedule as there are no orders in. ? ? ? ? ?Please advise  ?

## 2021-12-01 NOTE — Progress Notes (Signed)
? ?Subjective:  ? Patrick Pearson is a 56 y.o. male who presents for Medicare Annual/Subsequent preventive examination. ? ?Review of Systems    ? ? ?   ?Objective:  ?  ?Today's Vitals  ? 12/01/21 0902  ?BP: 124/64  ?Pulse: 68  ?Temp: 98.1 ?F (36.7 ?C)  ?TempSrc: Oral  ?SpO2: 98%  ?Weight: 261 lb 12.8 oz (118.8 kg)  ?Height: '6\' 1"'$  (1.854 m)  ? ?Body mass index is 34.54 kg/m?. ? ? ?  12/01/2021  ?  9:22 AM 07/15/2021  ?  8:27 AM 11/27/2020  ?  8:28 AM 11/05/2020  ?  8:29 AM 10/05/2019  ?  2:28 PM  ?Advanced Directives  ?Does Patient Have a Medical Advance Directive? No No Yes No No  ?Does patient want to make changes to medical advance directive? No - Patient declined  No - Patient declined    ?Would patient like information on creating a medical advance directive? No - Patient declined      ? ? ?Current Medications (verified) ?Outpatient Encounter Medications as of 12/01/2021  ?Medication Sig  ? acetaminophen (TYLENOL) 500 MG tablet Take 1,000 mg by mouth every 6 (six) hours as needed.  ? cetirizine (ZYRTEC) 10 MG tablet Take 10 mg daily as needed by mouth.   ? ibuprofen (ADVIL,MOTRIN) 200 MG tablet Take 400 mg by mouth every 6 (six) hours as needed for moderate pain.  ? lisinopril-hydrochlorothiazide (ZESTORETIC) 20-25 MG tablet TAKE 1 TABLET EVERY DAY  ? rosuvastatin (CRESTOR) 40 MG tablet TAKE 1 TABLET EVERY DAY  ? sildenafil (VIAGRA) 100 MG tablet Take 1 tablet (100 mg total) by mouth as needed for erectile dysfunction.  ? vitamin B-12 (CYANOCOBALAMIN) 100 MCG tablet Take 100 mcg by mouth daily.  ? vitamin C (ASCORBIC ACID) 500 MG tablet Take 500 mg by mouth daily.  ? zonisamide (ZONEGRAN) 100 MG capsule TAKE 3 CAPSULES EVERY NIGHT  ? ?No facility-administered encounter medications on file as of 12/01/2021.  ? ? ?Allergies (verified) ?Patient has no known allergies.  ? ?History: ?Past Medical History:  ?Diagnosis Date  ? Allergy   ? Aortic stenosis 11/20/2011  ? DEPRESSION 04/15/2007  ? Qualifier: Diagnosis of  By: Sarajane Jews MD,  Ishmael Holter   ? DIVERTICULITIS, HX OF 04/15/2007  ? Qualifier: Diagnosis of  By: Sarajane Jews MD, Ishmael Holter   ? DOE (dyspnea on exertion) 11/03/2011  ? Echo 10/2011:  Normal LV, bicuspid aortic valve, normal RV PFT"s 10/2011:  Totally normal    ? Elevated BP 05/18/2014  ? controlled with med  ? ERECTILE DYSFUNCTION 04/02/2008  ? Qualifier: Diagnosis of  By: Sarajane Jews MD, Ishmael Holter   ? HEAD TRAUMA, CLOSED 04/02/2008  ? Qualifier: Diagnosis of  By: Sarajane Jews MD, Ishmael Holter   ? HYPERGLYCEMIA 09/29/2010  ? Qualifier: Diagnosis of  By: Sarajane Jews MD, Ishmael Holter   ? HYPERLIPIDEMIA 07/11/2007  ? Qualifier: Diagnosis of  By: Sherlynn Stalls, [place], [place]    ? Hypertension   ? Migraines   ? last one over 2 years ago  ? MYALGIA 11/19/2009  ? Qualifier: Diagnosis of  By: Sarajane Jews MD, Annie Main A   ? OSA (obstructive sleep apnea) 11/03/2011  ? NPSG 2007:  AHI 54/hr, desat to 58% On CPAP   ? OTHER SPEECH DISTURBANCE 09/30/2007  ? Qualifier: Diagnosis of  By: Sherlynn Stalls, [place], [place]    ? Seizures ([place])   ? last seizure over 2 years ago - controlled with med  ? Sleep apnea   ? uses CPAP nightly  ?  SYNCOPE 09/29/2010  ? Qualifier: Diagnosis of  By: Sarajane Jews MD, Ishmael Holter   ? 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 from [place]   ? UNS ADVRS EFF UNS RX MEDICINAL&BIOLOGICAL SBSTNC 01/15/2010  ? Qualifier: Diagnosis of  By: Joyce Gross    ? ?Past Surgical History:  ?Procedure Laterality Date  ? ANKLE SURGERY    ? left  ? COLONOSCOPY  07/28/2017  ? per Dr. Carlean Purl, clear, repeat in 10 yrs   ? VASECTOMY    ? WISDOM TOOTH EXTRACTION    ? ?Family History  ?Problem Relation Age of Onset  ? Breast cancer Mother   ? Colon polyps Sister   ? Alcohol abuse Other   ? Arthritis Other   ? Hyperlipidemia Other   ? Mental illness Other   ? Colon cancer Neg Hx   ? Rectal cancer Neg Hx   ? Stomach cancer Neg Hx   ? ?Social History  ? ?Socioeconomic History  ? Marital status: Single  ?  Spouse name: Not on file  ? Number of children: 0  ? Years of education: Not on file  ? Highest education level: Not on file   ?Occupational History  ? Occupation: disabled.   ?  Employer: UNEMPLOYED  ?Tobacco Use  ? Smoking status: Former  ?  Packs/day: 1.00  ?  Years: 20.00  ?  Pack years: 20.00  ?  Types: Cigarettes  ?  Quit date: 09/01/1999  ?  Years since quitting: 22.2  ? Smokeless tobacco: Never  ?Vaping Use  ? Vaping Use: Never used  ?Substance and Sexual Activity  ? Alcohol use: Yes  ?  Alcohol/week: 4.0 standard drinks  ?  Types: 4 Cans of beer per week  ? Drug use: No  ? Sexual activity: Not on file  ?Other Topics Concern  ? Not on file  ?Social History Narrative  ? Lives alone.  ?   ? Right handed   ? ?Social Determinants of Health  ? ?Financial Resource Strain: Low Risk   ? Difficulty of Paying Living Expenses: Not hard at all  ?Food Insecurity: No Food Insecurity  ? Worried About Charity fundraiser in the Last Year: Never true  ? Ran Out of Food in the Last Year: Never true  ?Transportation Needs: No Transportation Needs  ? Lack of Transportation (Medical): No  ? Lack of Transportation (Non-Medical): No  ?Physical Activity: Insufficiently Active  ? Days of Exercise per Week: 2 days  ? Minutes of Exercise per Session: 60 min  ?Stress: No Stress Concern Present  ? Feeling of Stress : Not at all  ?Social Connections: Moderately Integrated  ? Frequency of Communication with Friends and Family: More than three times a week  ? Frequency of Social Gatherings with Friends and Family: More than three times a week  ? Attends Religious Services: More than 4 times per year  ? Active Member of Clubs or Organizations: Yes  ? Attends Archivist Meetings: More than 4 times per year  ? Marital Status: Never married  ? ? ? ?Clinical Intake: ?How often do you need to have someone help you when you read instructions, pamphlets, or other written materials from your doctor or pharmacy?: 1 - Never ? ?Diabetic?  No ? ?Interpreter Needed?: NoActivities of Daily Living ? ?  12/01/2021  ?  9:17 AM  ?In your present state of health, do you have  any difficulty performing the following activities:  ?Hearing? 0  ?  Vision? 0  ?Difficulty concentrating or making decisions? 0  ?Walking or climbing stairs? 1  ?Comment Patient uses a cane  ?Dressing or bathing? 0  ?Doing errands, shopping? 0  ?Preparing Food and eating ? N  ?Using the Toilet? N  ?In the past six months, have you accidently leaked urine? N  ?Do you have problems with loss of bowel control? N  ?Managing your Medications? N  ?Managing your Finances? N  ?Housekeeping or managing your Housekeeping? N  ? ? ?Patient Care Team: ?Laurey Morale, MD as PCP - General ?Cameron Sprang, MD as Consulting Physician (Neurology) ? ?Indicate any recent Medical Services you may have received from other than [facility] providers in the past year (date may be approximate). ? ?   ?Assessment:  ? This is a routine wellness examination for [facility]. ? ?Hearing/Vision screen ?Hearing Screening - Comments:: No hearing difficulty ?Vision Screening - Comments:: No vision difficulty ? ?Dietary issues and exercise activities discussed: ?Exercise limited by: None identified ? ? Goals Addressed   ? ?  ?  ?  ?  ?  ? This Visit's Progress  ?   Lose weight (pt-stated)     ?   I want to continue losing weight. I'm happy losing weight ?  ? ?  ? ?  ? ?Depression Screen ? ?  12/01/2021  ?  9:14 AM 11/27/2020  ?  8:50 AM 11/27/2020  ?  8:49 AM 11/27/2020  ?  8:31 AM 09/05/2020  ?  8:23 AM  ?PHQ 2/9 Scores  ?PHQ - 2 Score 0 0 0 0 0  ?  ?Fall Risk ? ?  12/01/2021  ?  9:20 AM 07/15/2021  ?  8:27 AM 11/27/2020  ?  8:30 AM 11/05/2020  ?  8:29 AM 09/05/2020  ?  8:23 AM  ?Fall Risk   ?Falls in the past year? 0 0 0 1 1  ?Number falls in past yr: 0 0 0 1 0  ?Injury with Fall? 0 0 0 0 0  ?Risk for fall due to : No Fall Risks  Impaired balance/gait    ?Follow up   Falls evaluation completed    ? ? ?FALL RISK PREVENTION PERTAINING TO THE HOME: ? ?Any stairs in or around the home? Yes  ?If so, are there any without handrails? No  ?Home free of loose throw rugs in  walkways, pet beds, electrical cords, etc? Yes  ?Adequate lighting in your home to reduce risk of falls? Yes  ? ?ASSISTIVE DEVICES UTILIZED TO PREVENT FALLS: ? ?Life alert? No  ?Use of a cane, walker or w/c? Yes  ?Grab bars

## 2021-12-01 NOTE — Patient Instructions (Addendum)
?Mr. Dombek , ?Thank you for taking time to come for your Medicare Wellness Visit. I appreciate your ongoing commitment to your health goals. Please review the following plan we discussed and let me know if I can assist you in the future.  ? ?These are the goals we discussed: ? Goals   ? ?   Lose weight (pt-stated)   ?   I want to continue losing weight. I'm happy losing weight ?  ? ?  ? ?  ?  ?This is a list of the screening recommended for you and due dates:  ?Health Maintenance  ?Topic Date Due  ? Complete foot exam   Never done  ? Hemoglobin A1C  03/05/2021  ? Eye exam for diabetics  12/01/2021*  ? COVID-19 Vaccine (2 - Pfizer series) 12/17/2021*  ? Hepatitis C Screening: USPSTF Recommendation to screen - Ages 18-79 yo.  12/02/2022*  ? HIV Screening  12/02/2022*  ? Flu Shot  03/31/2022  ? Tetanus Vaccine  05/27/2022  ? Colon Cancer Screening  07/29/2027  ? Zoster (Shingles) Vaccine  Completed  ? HPV Vaccine  Aged Out  ?*Topic was postponed. The date shown is not the original due date.  ? ?Advanced directives: No Patient deferred ? ?Conditions/risks identified: None ? ?Next appointment: Follow up in one year for your annual wellness visit  ? ?Preventive Care 40-64 Years, Male ?Preventive care refers to lifestyle choices and visits with your health care provider that can promote health and wellness. ?What does preventive care include? ?A yearly physical exam. This is also called an annual well check. ?Dental exams once or twice a year. ?Routine eye exams. Ask your health care provider how often you should have your eyes checked. ?Personal lifestyle choices, including: ?Daily care of your teeth and gums. ?Regular physical activity. ?Eating a healthy diet. ?Avoiding tobacco and drug use. ?Limiting alcohol use. ?Practicing safe sex. ?Taking low-dose aspirin every day starting at age 47. ?What happens during an annual well check? ?The services and screenings done by your health care provider during your annual well  check will depend on your age, overall health, lifestyle risk factors, and family history of disease. ?Counseling  ?Your health care provider may ask you questions about your: ?Alcohol use. ?Tobacco use. ?Drug use. ?Emotional well-being. ?Home and relationship well-being. ?Sexual activity. ?Eating habits. ?Work and work Statistician. ?Screening  ?You may have the following tests or measurements: ?Height, weight, and BMI. ?Blood pressure. ?Lipid and cholesterol levels. These may be checked every 5 years, or more frequently if you are over 39 years old. ?Skin check. ?Lung cancer screening. You may have this screening every year starting at age 52 if you have a 30-pack-year history of smoking and currently smoke or have quit within the past 15 years. ?Fecal occult blood test (FOBT) of the stool. You may have this test every year starting at age 48. ?Flexible sigmoidoscopy or colonoscopy. You may have a sigmoidoscopy every 5 years or a colonoscopy every 10 years starting at age 67. ?Prostate cancer screening. Recommendations will vary depending on your family history and other risks. ?Hepatitis C blood test. ?Hepatitis B blood test. ?Sexually transmitted disease (STD) testing. ?Diabetes screening. This is done by checking your blood sugar (glucose) after you have not eaten for a while (fasting). You may have this done every 1-3 years. ?Discuss your test results, treatment options, and if necessary, the need for more tests with your health care provider. ?Vaccines  ?Your health care provider may recommend certain  vaccines, such as: ?Influenza vaccine. This is recommended every year. ?Tetanus, diphtheria, and acellular pertussis (Tdap, Td) vaccine. You may need a Td booster every 10 years. ?Zoster vaccine. You may need this after age 63. ?Pneumococcal 13-valent conjugate (PCV13) vaccine. You may need this if you have certain conditions and have not been vaccinated. ?Pneumococcal polysaccharide (PPSV23) vaccine. You may  need one or two doses if you smoke cigarettes or if you have certain conditions. ?Talk to your health care provider about which screenings and vaccines you need and how often you need them. ?This information is not intended to replace advice given to you by your health care provider. Make sure you discuss any questions you have with your health care provider. ?Document Released: 09/13/2015 Document Revised: 05/06/2016 Document Reviewed: 06/18/2015 ?Elsevier Interactive Patient Education ? 2017 [place]. ? ?Fall Prevention in the Home ?Falls can cause injuries. They can happen to people of all ages. There are many things you can do to make your home safe and to help prevent falls. ?What can I do on the outside of my home? ?Regularly fix the edges of walkways and driveways and fix any cracks. ?Remove anything that might make you trip as you walk through a door, such as a raised step or threshold. ?Trim any bushes or trees on the path to your home. ?Use bright outdoor lighting. ?Clear any walking paths of anything that might make someone trip, such as rocks or tools. ?Regularly check to see if handrails are loose or broken. Make sure that both sides of any steps have handrails. ?Any raised decks and porches should have guardrails on the edges. ?Have any leaves, snow, or ice cleared regularly. ?Use sand or salt on walking paths during winter. ?Clean up any spills in your garage right away. This includes oil or grease spills. ?What can I do in the bathroom? ?Use night lights. ?Install grab bars by the toilet and in the tub and shower. Do not use towel bars as grab bars. ?Use non-skid mats or decals in the tub or shower. ?If you need to sit down in the shower, use a plastic, non-slip stool. ?Keep the floor dry. Clean up any water that spills on the floor as soon as it happens. ?Remove soap buildup in the tub or shower regularly. ?Attach bath mats securely with double-sided non-slip rug tape. ?Do not have throw rugs  and other things on the floor that can make you trip. ?What can I do in the bedroom? ?Use night lights. ?Make sure that you have a light by your bed that is easy to reach. ?Do not use any sheets or blankets that are too big for your bed. They should not hang down onto the floor. ?Have a firm chair that has side arms. You can use this for support while you get dressed. ?Do not have throw rugs and other things on the floor that can make you trip. ?What can I do in the kitchen? ?Clean up any spills right away. ?Avoid walking on wet floors. ?Keep items that you use a lot in easy-to-reach places. ?If you need to reach something above you, use a strong step stool that has a grab bar. ?Keep electrical cords out of the way. ?Do not use floor polish or wax that makes floors slippery. If you must use wax, use non-skid floor wax. ?Do not have throw rugs and other things on the floor that can make you trip. ?What can I do with my stairs? ?Do not leave  any items on the stairs. ?Make sure that there are handrails on both sides of the stairs and use them. Fix handrails that are broken or loose. Make sure that handrails are as long as the stairways. ?Check any carpeting to make sure that it is firmly attached to the stairs. Fix any carpet that is loose or worn. ?Avoid having throw rugs at the top or bottom of the stairs. If you do have throw rugs, attach them to the floor with carpet tape. ?Make sure that you have a light switch at the top of the stairs and the bottom of the stairs. If you do not have them, ask someone to add them for you. ?What else can I do to help prevent falls? ?Wear shoes that: ?Do not have high heels. ?Have rubber bottoms. ?Are comfortable and fit you well. ?Are closed at the toe. Do not wear sandals. ?If you use a stepladder: ?Make sure that it is fully opened. Do not climb a closed stepladder. ?Make sure that both sides of the stepladder are locked into place. ?Ask someone to hold it for you, if  possible. ?Clearly mark and make sure that you can see: ?Any grab bars or handrails. ?First and last steps. ?Where the edge of each step is. ?Use tools that help you move around (mobility aids) if they are needed. The

## 2021-12-02 NOTE — Telephone Encounter (Signed)
Called patient.   Lab appointment scheduled for 12/09/2021 at 7:40am. ?

## 2021-12-02 NOTE — Telephone Encounter (Signed)
The lab orders were placed  ?

## 2021-12-09 ENCOUNTER — Other Ambulatory Visit (INDEPENDENT_AMBULATORY_CARE_PROVIDER_SITE_OTHER): Payer: Medicare Other

## 2021-12-09 DIAGNOSIS — N401 Enlarged prostate with lower urinary tract symptoms: Secondary | ICD-10-CM | POA: Diagnosis not present

## 2021-12-09 DIAGNOSIS — N138 Other obstructive and reflux uropathy: Secondary | ICD-10-CM | POA: Diagnosis not present

## 2021-12-09 DIAGNOSIS — R7309 Other abnormal glucose: Secondary | ICD-10-CM | POA: Diagnosis not present

## 2021-12-09 DIAGNOSIS — I10 Essential (primary) hypertension: Secondary | ICD-10-CM

## 2021-12-09 LAB — LIPID PANEL
Cholesterol: 131 mg/dL (ref 0–200)
HDL: 35.6 mg/dL — ABNORMAL LOW (ref >39.00)
LDL Cholesterol: 76 mg/dL (ref 0–99)
NonHDL: 95.82
Total CHOL/HDL Ratio: 4
Triglycerides: 97 mg/dL (ref 0.0–149.0)
VLDL: 19.4 mg/dL (ref 0.0–40.0)

## 2021-12-09 LAB — BASIC METABOLIC PANEL
BUN: 26 mg/dL — ABNORMAL HIGH (ref 6–23)
CO2: 28 mEq/L (ref 19–32)
Calcium: 9.5 mg/dL (ref 8.4–10.5)
Chloride: 108 mEq/L (ref 96–112)
Creatinine, Ser: 1.33 mg/dL (ref 0.40–1.50)
GFR: 60.43 mL/min (ref >60.00)
Glucose, Bld: 111 mg/dL — ABNORMAL HIGH (ref 70–99)
Potassium: 4.2 mEq/L (ref 3.5–5.1)
Sodium: 143 mEq/L (ref 135–145)

## 2021-12-09 LAB — CBC WITH DIFFERENTIAL/PLATELET
Basophils Absolute: 0 10*3/uL (ref 0.0–0.1)
Basophils Relative: 0.6 % (ref 0.0–3.0)
Eosinophils Absolute: 0.1 10*3/uL (ref 0.0–0.7)
Eosinophils Relative: 1.8 % (ref 0.0–5.0)
HCT: 42.9 % (ref 39.0–52.0)
Hemoglobin: 14.5 g/dL (ref 13.0–17.0)
Lymphocytes Relative: 27.8 % (ref 12.0–46.0)
Lymphs Abs: 1.7 10*3/uL (ref 0.7–4.0)
MCHC: 33.8 g/dL (ref 30.0–36.0)
MCV: 89.4 fl (ref 78.0–100.0)
Monocytes Absolute: 0.6 10*3/uL (ref 0.1–1.0)
Monocytes Relative: 9.2 % (ref 3.0–12.0)
Neutro Abs: 3.8 10*3/uL (ref 1.4–7.7)
Neutrophils Relative %: 60.6 % (ref 43.0–77.0)
Platelets: 182 10*3/uL (ref 150.0–400.0)
RBC: 4.79 Mil/uL (ref 4.22–5.81)
RDW: 13.8 % (ref 11.5–15.5)
WBC: 6.3 10*3/uL (ref 4.0–10.5)

## 2021-12-09 LAB — TSH: TSH: 2.74 u[IU]/mL (ref 0.35–5.50)

## 2021-12-09 LAB — HEPATIC FUNCTION PANEL
ALT: 30 U/L (ref 0–53)
AST: 21 U/L (ref 0–37)
Albumin: 4.2 g/dL (ref 3.5–5.2)
Alkaline Phosphatase: 60 U/L (ref 39–117)
Bilirubin, Direct: 0.1 mg/dL (ref 0.0–0.3)
Total Bilirubin: 0.4 mg/dL (ref 0.2–1.2)
Total Protein: 5.9 g/dL — ABNORMAL LOW (ref 6.0–8.3)

## 2021-12-09 LAB — PSA: PSA: 0.95 ng/mL (ref 0.10–4.00)

## 2021-12-09 LAB — HEMOGLOBIN A1C: Hgb A1c MFr Bld: 6 % (ref 4.6–6.5)

## 2021-12-11 ENCOUNTER — Ambulatory Visit (INDEPENDENT_AMBULATORY_CARE_PROVIDER_SITE_OTHER): Payer: Medicare Other | Admitting: Family Medicine

## 2021-12-11 ENCOUNTER — Encounter: Payer: Self-pay | Admitting: Family Medicine

## 2021-12-11 VITALS — BP 110/70 | HR 64 | Temp 99.7°F | Wt 259.8 lb

## 2021-12-11 DIAGNOSIS — E785 Hyperlipidemia, unspecified: Secondary | ICD-10-CM | POA: Diagnosis not present

## 2021-12-11 DIAGNOSIS — G40109 Localization-related (focal) (partial) symptomatic epilepsy and epileptic syndromes with simple partial seizures, not intractable, without status epilepticus: Secondary | ICD-10-CM | POA: Diagnosis not present

## 2021-12-11 DIAGNOSIS — N529 Male erectile dysfunction, unspecified: Secondary | ICD-10-CM | POA: Insufficient documentation

## 2021-12-11 DIAGNOSIS — I10 Essential (primary) hypertension: Secondary | ICD-10-CM | POA: Diagnosis not present

## 2021-12-11 DIAGNOSIS — Z87820 Personal history of traumatic brain injury: Secondary | ICD-10-CM

## 2021-12-11 DIAGNOSIS — I350 Nonrheumatic aortic (valve) stenosis: Secondary | ICD-10-CM | POA: Diagnosis not present

## 2021-12-11 DIAGNOSIS — G43101 Migraine with aura, not intractable, with status migrainosus: Secondary | ICD-10-CM | POA: Diagnosis not present

## 2021-12-11 DIAGNOSIS — G4733 Obstructive sleep apnea (adult) (pediatric): Secondary | ICD-10-CM

## 2021-12-11 NOTE — Progress Notes (Signed)
? ?Subjective:  ? ? Patient ID: Patrick Pearson, male    DOB: Dec 30, 1965, 56 y.o.   MRN: [id number] ? ?HPI ?Here to follow up on issues. He feels well with no concerns. His BP has been stable. His seizures have been well controlled, and he sees Dr. Delice Lesch regularly.  ?His migraines are stable. He had fasting labs a few days ago and his LDL was 7.6 and his A1c was 6.0. His ED is stable. The last ECHO he had was on 07-27-21 and this showed his AV stenosis to be stable.  ? ?Review of Systems  ?Constitutional: Negative.   ?HENT: Negative.    ?Eyes: Negative.   ?Respiratory: Negative.    ?Cardiovascular: Negative.   ?Gastrointestinal: Negative.   ?Genitourinary: Negative.   ?Musculoskeletal: Negative.   ?Skin: Negative.   ?Neurological: Negative.   ?Psychiatric/Behavioral: Negative.    ? ?   ?Objective:  ? Physical Exam ?Constitutional:   ?   General: He is not in acute distress. ?   Appearance: He is well-developed. He is obese. He is not ill-appearing or diaphoretic.  ?   Comments: Walks with a cane   ?HENT:  ?   Head: Normocephalic and atraumatic.  ?   Right Ear: External ear normal.  ?   Left Ear: External ear normal.  ?   Nose: Nose normal.  ?   Mouth/Throat:  ?   Pharynx: No oropharyngeal exudate.  ?Eyes:  ?   General: No scleral icterus.    ?   Right eye: No discharge.     ?   Left eye: No discharge.  ?   Conjunctiva/sclera: Conjunctivae normal.  ?   Pupils: Pupils are equal, round, and reactive to light.  ?Neck:  ?   Thyroid: No thyromegaly.  ?   Vascular: No JVD.  ?   Trachea: No tracheal deviation.  ?Cardiovascular:  ?   Rate and Rhythm: Normal rate and regular rhythm.  ?   Pulses: Normal pulses.  ?   Heart sounds: No murmur heard. ?  No friction rub. No gallop.  ?   Comments: Has his usual 2/6 SM  ?Pulmonary:  ?   Effort: Pulmonary effort is normal. No respiratory distress.  ?   Breath sounds: Normal breath sounds. No wheezing or rales.  ?Chest:  ?   Chest wall: No tenderness.  ?Abdominal:  ?   General: Bowel  sounds are normal. There is no distension.  ?   Palpations: Abdomen is soft. There is no mass.  ?   Tenderness: There is no abdominal tenderness. There is no guarding or rebound.  ?Genitourinary: ?   Penis: Normal. No tenderness.   ?   Testes: Normal.  ?   Prostate: Normal.  ?   Rectum: Normal. Guaiac result negative.  ?Musculoskeletal:     ?   General: No tenderness. Normal range of motion.  ?   Cervical back: Neck supple.  ?Lymphadenopathy:  ?   Cervical: No cervical adenopathy.  ?Skin: ?   General: Skin is warm and dry.  ?   Coloration: Skin is not pale.  ?   Findings: No erythema or rash.  ?Neurological:  ?   Mental Status: He is alert and oriented to person, place, and time.  ?   Cranial Nerves: No cranial nerve deficit.  ?   Motor: No abnormal muscle tone.  ?   Coordination: Coordination normal.  ?   Deep Tendon Reflexes: Reflexes are normal and symmetric. Reflexes normal.  ?  Psychiatric:     ?   Behavior: Behavior normal.     ?   Thought Content: Thought content normal.     ?   Judgment: Judgment normal.  ? ? ? ? ? ?   ?Assessment & Plan:  ?His HTN and aortic stenosis and epilepsy are stable. His ED is well controlled. His dyslipidemia is stable. We spent a total of ( 33  ) minutes reviewing records and discussing these issues.  ?Alysia Penna, MD ? ? ?

## 2022-02-12 ENCOUNTER — Ambulatory Visit: Payer: Medicare Other | Admitting: Cardiology
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
## 2022-03-05 ENCOUNTER — Encounter: Payer: Self-pay | Admitting: Cardiology

## 2022-03-05 ENCOUNTER — Ambulatory Visit (INDEPENDENT_AMBULATORY_CARE_PROVIDER_SITE_OTHER): Payer: Medicare Other | Admitting: Cardiology

## 2022-03-05 VITALS — BP 102/70 | HR 59 | Ht 74.0 in | Wt 237.4 lb

## 2022-03-05 DIAGNOSIS — I350 Nonrheumatic aortic (valve) stenosis: Secondary | ICD-10-CM

## 2022-03-05 NOTE — Patient Instructions (Signed)
Medication Instructions:  No changes *If you need a refill on your cardiac medications before your next appointment, please call your pharmacy*   Lab Work: None ordered If you have labs (blood work) drawn today and your tests are completely normal, you will receive your results only by: [place] (if you have MyChart) OR A paper copy in the mail If you have any lab test that is abnormal or we need to change your treatment, we will call you to review the results.   Testing/Procedures: Your physician has requested that you have an echocardiogram in January. Echocardiography is a painless test that uses sound waves to create images of your heart. It provides your doctor with information about the size and shape of your heart and how well your heart's chambers and valves are working. You may receive an ultrasound enhancing agent through an IV if needed to better visualize your heart during the echo.This procedure takes approximately one hour. There are no restrictions for this procedure. This will take place at the [address], [address].     Follow-Up: At [facility], you and your health needs are our priority.  As part of our continuing mission to provide you with exceptional heart care, we have created designated Provider Care Teams.  These Care Teams include your primary Cardiologist (physician) and Advanced Practice Providers (APPs -  Physician Assistants and Nurse Practitioners) who all work together to provide you with the care you need, when you need it.  We recommend signing up for the patient portal called "MyChart".  Sign up information is provided on this After Visit Summary.  MyChart is used to connect with patients for Virtual Visits (Telemedicine).  Patients are able to view lab/test results, encounter notes, upcoming appointments, etc.  Non-urgent messages can be sent to your provider as well.   To learn more about what you can do with MyChart, go to  [email or web address].    Your next appointment:   12 month(s)  The format for your next appointment:   In Person  Provider:   Dr. Percival Spanish  Important Information About Sugar

## 2022-05-15 ENCOUNTER — Ambulatory Visit (INDEPENDENT_AMBULATORY_CARE_PROVIDER_SITE_OTHER): Payer: Medicare Other | Admitting: Adult Health

## 2022-05-15 ENCOUNTER — Encounter: Payer: Self-pay | Admitting: Adult Health

## 2022-05-15 VITALS — BP 132/78 | HR 60 | Ht 74.0 in | Wt 238.0 lb

## 2022-05-15 DIAGNOSIS — G4733 Obstructive sleep apnea (adult) (pediatric): Secondary | ICD-10-CM

## 2022-05-15 DIAGNOSIS — Z23 Encounter for immunization: Secondary | ICD-10-CM | POA: Diagnosis not present

## 2022-05-15 NOTE — Assessment & Plan Note (Addendum)
Severe obstructive sleep apnea with excellent compliance and control on CPAP. Order for new CPAP.   Plan  Patient Instructions  Keep up great job.  Continue on CPAP At bedtime   Order for new CPAP  Flu shot today .  Follow up with Dr. Elsworth Soho  In 1 year and As needed

## 2022-05-15 NOTE — Patient Instructions (Addendum)
Keep up great job.  Continue on CPAP At bedtime   Order for new CPAP  Flu shot today .  Follow up with Dr. Elsworth Soho  In 1 year and As needed
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
## 2022-06-13 DIAGNOSIS — Z23 Encounter for immunization: Secondary | ICD-10-CM | POA: Diagnosis not present

## 2022-06-19 ENCOUNTER — Emergency Department (HOSPITAL_COMMUNITY)
Admission: EM | Admit: 2022-06-19 | Discharge: 2022-07-01 | Disposition: E | Payer: Medicare Other | Attending: Emergency Medicine | Admitting: Emergency Medicine

## 2022-06-19 DIAGNOSIS — X58XXXA Exposure to other specified factors, initial encounter: Secondary | ICD-10-CM | POA: Diagnosis not present

## 2022-06-19 DIAGNOSIS — W19XXXA Unspecified fall, initial encounter: Secondary | ICD-10-CM | POA: Diagnosis not present

## 2022-06-19 DIAGNOSIS — R0689 Other abnormalities of breathing: Secondary | ICD-10-CM | POA: Diagnosis not present

## 2022-06-19 DIAGNOSIS — R000 Tachycardia, unspecified: Secondary | ICD-10-CM | POA: Diagnosis not present

## 2022-06-19 DIAGNOSIS — I469 Cardiac arrest, cause unspecified: Secondary | ICD-10-CM | POA: Insufficient documentation

## 2022-06-19 DIAGNOSIS — R404 Transient alteration of awareness: Secondary | ICD-10-CM | POA: Diagnosis not present

## 2022-06-19 DIAGNOSIS — S0033XA Contusion of nose, initial encounter: Secondary | ICD-10-CM | POA: Diagnosis not present

## 2022-06-20 ENCOUNTER — Encounter: Payer: Self-pay | Admitting: Family Medicine

## 2022-06-22 ENCOUNTER — Telehealth: Payer: Self-pay | Admitting: Family Medicine

## 2022-06-22 NOTE — Telephone Encounter (Signed)
Pt's mother called to inform MD that this Pt passed away on Jul 06, 2022.

## 2022-07-01 NOTE — ED Provider Notes (Signed)
Mother at bedside and up to date.   This will be an ME case. Nursing and tech support staff notified not to remove any tubes/lines.  Lianne Cure 7:11 PM 65/79/03    Lianne Cure, DO 83/33/83 1550
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
## 2022-07-01 NOTE — ED Notes (Signed)
Pt belongings given to pt's mother who is at bedside.
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
## 2022-07-01 NOTE — ED Notes (Signed)
Called pt placement to notify of checklist completion.

## 2022-07-01 NOTE — Code Documentation (Signed)
TOD pronounced by Tia Alert MD at 1440

## 2022-07-01 DEATH — deceased

## 2022-07-16 ENCOUNTER — Ambulatory Visit: Payer: Medicare Other | Admitting: Neurology

## 2022-09-28 ENCOUNTER — Other Ambulatory Visit (HOSPITAL_COMMUNITY): Payer: Medicare Other
